# Patient Record
Sex: Female | Born: 1935 | Race: White | Hispanic: No | Smoking: Never smoker
Health system: Southern US, Community
[De-identification: ages and names within clinical notes are randomized; demographics above are authoritative.]

## PROBLEM LIST (undated history)

## (undated) DIAGNOSIS — I10 Essential (primary) hypertension: Secondary | ICD-10-CM

## (undated) HISTORY — PX: OTHER SURGICAL HISTORY: SHX169

## (undated) HISTORY — PX: JOINT REPLACEMENT: SHX530

## (undated) HISTORY — PX: LUMBAR FUSION: SHX111

## (undated) HISTORY — PX: TONSILLECTOMY: SUR1361

## (undated) HISTORY — PX: APPENDECTOMY: SHX54

## (undated) HISTORY — PX: ABDOMINAL HYSTERECTOMY: SHX81

## (undated) HISTORY — PX: BREAST BIOPSY: SHX20

---

## 2000-07-30 ENCOUNTER — Ambulatory Visit (HOSPITAL_COMMUNITY): Admission: RE | Admit: 2000-07-30 | Discharge: 2000-07-30 | Payer: Self-pay | Admitting: Family Medicine

## 2000-07-30 ENCOUNTER — Encounter: Payer: Self-pay | Admitting: Family Medicine

## 2000-07-31 ENCOUNTER — Ambulatory Visit (HOSPITAL_COMMUNITY): Admission: RE | Admit: 2000-07-31 | Discharge: 2000-07-31 | Payer: Self-pay | Admitting: Family Medicine

## 2000-07-31 ENCOUNTER — Encounter: Payer: Self-pay | Admitting: Family Medicine

## 2000-09-10 ENCOUNTER — Ambulatory Visit (HOSPITAL_COMMUNITY): Admission: RE | Admit: 2000-09-10 | Discharge: 2000-09-10 | Payer: Self-pay | Admitting: Cardiology

## 2000-09-17 ENCOUNTER — Encounter: Payer: Self-pay | Admitting: Cardiology

## 2000-09-17 ENCOUNTER — Ambulatory Visit (HOSPITAL_COMMUNITY): Admission: RE | Admit: 2000-09-17 | Discharge: 2000-09-17 | Payer: Self-pay | Admitting: Cardiology

## 2001-09-15 ENCOUNTER — Ambulatory Visit (HOSPITAL_COMMUNITY): Admission: RE | Admit: 2001-09-15 | Discharge: 2001-09-15 | Payer: Self-pay | Admitting: Family Medicine

## 2001-09-15 ENCOUNTER — Encounter: Payer: Self-pay | Admitting: Family Medicine

## 2002-12-14 ENCOUNTER — Ambulatory Visit (HOSPITAL_COMMUNITY): Admission: RE | Admit: 2002-12-14 | Discharge: 2002-12-14 | Payer: Self-pay | Admitting: Family Medicine

## 2002-12-14 ENCOUNTER — Encounter: Payer: Self-pay | Admitting: Family Medicine

## 2002-12-20 ENCOUNTER — Ambulatory Visit (HOSPITAL_COMMUNITY): Admission: RE | Admit: 2002-12-20 | Discharge: 2002-12-20 | Payer: Self-pay | Admitting: Family Medicine

## 2002-12-20 ENCOUNTER — Encounter: Payer: Self-pay | Admitting: Family Medicine

## 2003-06-15 ENCOUNTER — Encounter (HOSPITAL_COMMUNITY): Admission: RE | Admit: 2003-06-15 | Discharge: 2003-07-15 | Payer: Self-pay | Admitting: Family Medicine

## 2003-12-09 ENCOUNTER — Encounter (HOSPITAL_COMMUNITY): Admission: RE | Admit: 2003-12-09 | Discharge: 2004-01-08 | Payer: Self-pay | Admitting: Family Medicine

## 2004-01-10 ENCOUNTER — Ambulatory Visit (HOSPITAL_COMMUNITY): Admission: RE | Admit: 2004-01-10 | Discharge: 2004-01-10 | Payer: Self-pay | Admitting: Family Medicine

## 2004-01-18 ENCOUNTER — Ambulatory Visit (HOSPITAL_COMMUNITY): Admission: RE | Admit: 2004-01-18 | Discharge: 2004-01-18 | Payer: Self-pay | Admitting: Family Medicine

## 2004-02-01 ENCOUNTER — Encounter (HOSPITAL_COMMUNITY): Admission: RE | Admit: 2004-02-01 | Discharge: 2004-03-02 | Payer: Self-pay | Admitting: Family Medicine

## 2005-01-16 ENCOUNTER — Ambulatory Visit (HOSPITAL_COMMUNITY): Admission: RE | Admit: 2005-01-16 | Discharge: 2005-01-16 | Payer: Self-pay | Admitting: Family Medicine

## 2005-01-28 ENCOUNTER — Ambulatory Visit (HOSPITAL_COMMUNITY): Admission: RE | Admit: 2005-01-28 | Discharge: 2005-01-28 | Payer: Self-pay | Admitting: Family Medicine

## 2006-02-11 ENCOUNTER — Ambulatory Visit (HOSPITAL_COMMUNITY): Admission: RE | Admit: 2006-02-11 | Discharge: 2006-02-11 | Payer: Self-pay | Admitting: Family Medicine

## 2006-03-25 ENCOUNTER — Ambulatory Visit: Payer: Self-pay | Admitting: Orthopedic Surgery

## 2006-03-25 ENCOUNTER — Ambulatory Visit (HOSPITAL_COMMUNITY): Admission: RE | Admit: 2006-03-25 | Discharge: 2006-03-25 | Payer: Self-pay | Admitting: Family Medicine

## 2006-03-28 ENCOUNTER — Ambulatory Visit: Payer: Self-pay | Admitting: Orthopedic Surgery

## 2006-03-28 ENCOUNTER — Ambulatory Visit (HOSPITAL_COMMUNITY): Admission: RE | Admit: 2006-03-28 | Discharge: 2006-03-28 | Payer: Self-pay | Admitting: Orthopedic Surgery

## 2006-04-01 ENCOUNTER — Ambulatory Visit: Payer: Self-pay | Admitting: Orthopedic Surgery

## 2006-04-10 ENCOUNTER — Ambulatory Visit: Payer: Self-pay | Admitting: Orthopedic Surgery

## 2006-05-12 ENCOUNTER — Ambulatory Visit: Payer: Self-pay | Admitting: Orthopedic Surgery

## 2006-05-21 ENCOUNTER — Ambulatory Visit: Payer: Self-pay | Admitting: Orthopedic Surgery

## 2006-06-02 ENCOUNTER — Ambulatory Visit: Payer: Self-pay | Admitting: Orthopedic Surgery

## 2006-06-23 ENCOUNTER — Ambulatory Visit: Payer: Self-pay | Admitting: Orthopedic Surgery

## 2006-07-14 ENCOUNTER — Ambulatory Visit: Payer: Self-pay | Admitting: Orthopedic Surgery

## 2007-04-20 ENCOUNTER — Ambulatory Visit: Payer: Self-pay | Admitting: Orthopedic Surgery

## 2007-04-20 DIAGNOSIS — S92309A Fracture of unspecified metatarsal bone(s), unspecified foot, initial encounter for closed fracture: Secondary | ICD-10-CM | POA: Insufficient documentation

## 2007-07-02 ENCOUNTER — Ambulatory Visit: Payer: Self-pay | Admitting: Orthopedic Surgery

## 2009-01-12 ENCOUNTER — Emergency Department (HOSPITAL_COMMUNITY): Admission: EM | Admit: 2009-01-12 | Discharge: 2009-01-12 | Payer: Self-pay | Admitting: Emergency Medicine

## 2009-01-17 ENCOUNTER — Encounter (HOSPITAL_COMMUNITY): Admission: RE | Admit: 2009-01-17 | Discharge: 2009-02-16 | Payer: Self-pay | Admitting: Emergency Medicine

## 2009-01-24 ENCOUNTER — Ambulatory Visit: Payer: Self-pay | Admitting: Orthopedic Surgery

## 2009-01-24 DIAGNOSIS — M549 Dorsalgia, unspecified: Secondary | ICD-10-CM | POA: Insufficient documentation

## 2009-02-20 ENCOUNTER — Encounter (HOSPITAL_COMMUNITY): Admission: RE | Admit: 2009-02-20 | Discharge: 2009-03-22 | Payer: Self-pay | Admitting: Family Medicine

## 2010-07-20 NOTE — H&P (Signed)
[name], [name]              ACCOUNT NO.:  [id number]   MEDICAL RECORD NO.:  [id number]         PATIENT TYPE:  AMB   LOCATION:  DAY                           FACILITY:  [facility]   PHYSICIAN:  Vickki Hearing, M.D.DATE OF BIRTH:  27-Jul-1931   DATE OF ADMISSION:  DATE OF DISCHARGE:  LH                              HISTORY & PHYSICAL   HISTORY AND PHYSICAL   CHIEF COMPLAINT:  Pain right arm.   HISTORY:  This is a 77 year old female who fell on March 24, 2006 and  fractured the right distal radius and ulnar styloid.  She has right  wrist pain which is severe, constant, worsened with movement and  relieved with splinting and medication.  She describes sharp pain since  the date of the injury.  She fell at the [facility] parking lot.  Associated signs and symptoms include deformity and swelling.   MEDICAL REVIEW OF SYSTEMS:  Reveals history of joint swelling, itching,  psoriasis, sinusitis.  Denies weight loss, chest pain, shortness of  breath, GI symptoms, genitourinary problems, neurologic disease,  endocrine problems, psychiatric illness, glaucoma, cataracts with poor  vision.   ALLERGIES:  None.   MEDICAL PROBLEMS:  Hypertension.   PREVIOUS SURGERY:  Hysterectomy, tonsillectomy, breast tumors were  removed.   MEDICATIONS:  1. Premarin.  2. Anti-hypertensive medication.  3. Aspirin.  4. Prednisone.  5. Vicodin as needed.   FAMILY HISTORY:  Arthritis, diabetes.   FAMILY PHYSICIAN:  Dr. Karleen Ponce   SOCIAL HISTORY:  She is married, does not work.  Denies smoking, use of  alcohol or caffeine.  Last grade completed was 12.   PHYSICAL EXAMINATION:  VITAL SIGNS:  Weight 260, pulse 92, respiratory  rate is 18.  APPEARANCE:  Normal.  HEAD, EYES, EARS, NOSE, MOUTH AND THROAT:  Revealed abrasion right side  of her face.  RESPIRATORY:  Clear chest.  CARDIOVASCULAR:  Normal pulses and temperature.  No edema, tenderness,  swelling or varicose veins.  ABDOMEN:   Soft, nondistended, nontender.  LYMPH SYSTEMS:  Cervical lymph nodes benign.  MUSCULOSKELETAL:  Gait normal.  RIGHT UPPER EXTREMITY:  Deformity with malalignment.  Range of motion  not tested, too much pain.  Stability not tested, too much pain.  Muscle  strength not tested, too much pain.  SKIN:  Intact with abrasion over the elbow, but it is a closed fracture.  COORDINATION:  Tests deferred.  Upper extremity - Reflexes deferred.  Upper extremity - Sensory exam was normal to soft touch.  She was  oriented to time, person and place.  Mood and affect were normal.   RADIOGRAPHS:  Radiographs from [facility] show a comminuted distal radius  fracture with ulnar styloid fracture as well.   DIAGNOSIS:  Closed distal radius fracture right wrist.   PLAN:  Closed reduction percutaneous pinning and external fixation.      Vickki Hearing, M.D.  Electronically Signed     SEH/MEDQ  D:  03/27/2006  T:  03/27/2006  Job:  [id number]

## 2010-07-20 NOTE — Op Note (Signed)
NAMEMARCELE, [name]              ACCOUNT NO.:  [id number]   MEDICAL RECORD NO.:  [id number]          PATIENT TYPE:  AMB   LOCATION:  DAY                           FACILITY:  [facility]   PHYSICIAN:  Vickki Hearing, M.D.DATE OF BIRTH:  [birth year]   DATE OF PROCEDURE:  03/28/2006  DATE OF DISCHARGE:                               OPERATIVE REPORT   PREOP DIAGNOSIS:  Closed fracture right distal radius.   POSTOP DIAGNOSIS:  Closed fracture right distal radius.   PROCEDURE:  Closed reduction external fixation, plus percutaneous pins  right distal radius.   SURGEON:  Vickki Hearing, M.D.   ASSISTANTS:  None.   ANESTHETIC:  General, LMA.   OPERATIVE FINDINGS:  Comminuted distal fracture of the radius and ulnar  of the right wrist.  The fracture was closed.   HISTORY:  This 77 year old female fell at the [facility] parking lot and  sustained a closed fracture of the right distal radius; presented  directly to the office where evaluation and surgery was recommended.  The patient accepted the risks and benefits of the procedure, via  informed consent process.   DESCRIPTION OF PROCEDURE:  The patient was identified as Tracy S.  Ponce.  The right wrist was marked for surgery, countersigned by the  surgeon.  History and physical was updated.  Antibiotics were started.  The patient was taken to the operating for general anesthetic via LMA.  After successful anesthesia without complications.  The right upper  extremity was prepped and draped using sterile technique.  The time-out  procedure was then completed.  The procedure was confirmed as stated  above.   The tourniquet was elevated to 275 mmHg.  Closed manipulation was  performed.  Radiographs were done in orthogonal plane to check the  reduction after which finger traps were applied and 10 pounds of weight  were used to hold the reduction; and then pins were placed in the second  metacarpal bone.  Incision was made over  the bone blunt dissection was  carried down to the bone where subperiosteal dissection exposed the  bone.  Two pins were placed and checked with radiographs; 3-0 nylon was  used to close the wound.   Two percutaneous pins were placed in the distal radius; confirmed with C-  arm guidance.  We then made an incision over the mid-to-distal third of  the forearm; divided subcutaneous tissue, maintained hemostasis.  Subperiosteally dissected the bone until we exposed it well and placed 2  more pins which were also checked with radiographs.  The external  fixator was then applied.  Radiographs were checked in orthogonal views  which showed an excellent reduction.  Pins were cut and capped.  Wounds  were dressed; tourniquet was released; fingers had good flexion  passively, and no tension was noted.   The patient was extubated, taken to recovery room in stable condition.   The patient will be discharged on Vicodin 5 mg q.4 h. p.r.n. for pain.  She is also scheduled for followup visit.  She will wear the fixator for  8 weeks.  We will take  serial radiographs to assure reduction is  maintained.  She is encouraged to ice and elevate the hand, move the  fingers every 30 minutes while awake.      Vickki Hearing, M.D.  Electronically Signed     SEH/MEDQ  D:  03/28/2006  T:  03/28/2006  Job:  [id number]

## 2011-06-04 DIAGNOSIS — Z23 Encounter for immunization: Secondary | ICD-10-CM | POA: Diagnosis not present

## 2011-06-04 DIAGNOSIS — I10 Essential (primary) hypertension: Secondary | ICD-10-CM | POA: Diagnosis not present

## 2011-06-04 DIAGNOSIS — Z6841 Body Mass Index (BMI) 40.0 and over, adult: Secondary | ICD-10-CM | POA: Diagnosis not present

## 2011-06-04 DIAGNOSIS — L259 Unspecified contact dermatitis, unspecified cause: Secondary | ICD-10-CM | POA: Diagnosis not present

## 2011-12-19 DIAGNOSIS — Z23 Encounter for immunization: Secondary | ICD-10-CM | POA: Diagnosis not present

## 2012-01-17 ENCOUNTER — Other Ambulatory Visit (HOSPITAL_COMMUNITY): Payer: Self-pay | Admitting: Family Medicine

## 2012-01-17 DIAGNOSIS — L259 Unspecified contact dermatitis, unspecified cause: Secondary | ICD-10-CM | POA: Diagnosis not present

## 2012-01-17 DIAGNOSIS — Z23 Encounter for immunization: Secondary | ICD-10-CM | POA: Diagnosis not present

## 2012-01-17 DIAGNOSIS — Z79899 Other long term (current) drug therapy: Secondary | ICD-10-CM | POA: Diagnosis not present

## 2012-01-17 DIAGNOSIS — F411 Generalized anxiety disorder: Secondary | ICD-10-CM | POA: Diagnosis not present

## 2012-01-17 DIAGNOSIS — Z139 Encounter for screening, unspecified: Secondary | ICD-10-CM

## 2012-01-17 DIAGNOSIS — I10 Essential (primary) hypertension: Secondary | ICD-10-CM | POA: Diagnosis not present

## 2012-01-17 DIAGNOSIS — M549 Dorsalgia, unspecified: Secondary | ICD-10-CM | POA: Diagnosis not present

## 2012-01-17 DIAGNOSIS — N390 Urinary tract infection, site not specified: Secondary | ICD-10-CM | POA: Diagnosis not present

## 2012-01-21 ENCOUNTER — Ambulatory Visit (HOSPITAL_COMMUNITY)
Admission: RE | Admit: 2012-01-21 | Discharge: 2012-01-21 | Disposition: A | Discharge status: Home / Self Care | Payer: Medicare Other | Source: Ambulatory Visit | Attending: Family Medicine | Admitting: Family Medicine

## 2012-01-21 DIAGNOSIS — Z1231 Encounter for screening mammogram for malignant neoplasm of breast: Secondary | ICD-10-CM | POA: Insufficient documentation

## 2012-01-21 DIAGNOSIS — Z139 Encounter for screening, unspecified: Secondary | ICD-10-CM

## 2012-01-21 DIAGNOSIS — Z13820 Encounter for screening for osteoporosis: Secondary | ICD-10-CM | POA: Insufficient documentation

## 2012-01-21 DIAGNOSIS — Z780 Asymptomatic menopausal state: Secondary | ICD-10-CM | POA: Diagnosis not present

## 2012-03-02 DIAGNOSIS — J069 Acute upper respiratory infection, unspecified: Secondary | ICD-10-CM | POA: Diagnosis not present

## 2012-03-02 DIAGNOSIS — Z6839 Body mass index (BMI) 39.0-39.9, adult: Secondary | ICD-10-CM | POA: Diagnosis not present

## 2012-03-02 DIAGNOSIS — L259 Unspecified contact dermatitis, unspecified cause: Secondary | ICD-10-CM | POA: Diagnosis not present

## 2012-03-02 DIAGNOSIS — I10 Essential (primary) hypertension: Secondary | ICD-10-CM | POA: Diagnosis not present

## 2012-06-18 DIAGNOSIS — D235 Other benign neoplasm of skin of trunk: Secondary | ICD-10-CM | POA: Diagnosis not present

## 2012-06-18 DIAGNOSIS — L219 Seborrheic dermatitis, unspecified: Secondary | ICD-10-CM | POA: Diagnosis not present

## 2012-06-18 DIAGNOSIS — L719 Rosacea, unspecified: Secondary | ICD-10-CM | POA: Diagnosis not present

## 2012-07-13 DIAGNOSIS — H2510 Age-related nuclear cataract, unspecified eye: Secondary | ICD-10-CM | POA: Diagnosis not present

## 2012-08-17 DIAGNOSIS — H3530 Unspecified macular degeneration: Secondary | ICD-10-CM | POA: Diagnosis not present

## 2012-08-17 DIAGNOSIS — H2510 Age-related nuclear cataract, unspecified eye: Secondary | ICD-10-CM | POA: Diagnosis not present

## 2012-08-17 DIAGNOSIS — H04129 Dry eye syndrome of unspecified lacrimal gland: Secondary | ICD-10-CM | POA: Diagnosis not present

## 2012-08-19 ENCOUNTER — Encounter (HOSPITAL_COMMUNITY): Payer: Self-pay | Admitting: Pharmacy Technician

## 2012-08-28 ENCOUNTER — Encounter (HOSPITAL_COMMUNITY)
Admission: RE | Admit: 2012-08-28 | Discharge: 2012-08-28 | Disposition: A | Discharge status: Home / Self Care | Payer: Medicare Other | Source: Ambulatory Visit | Attending: Ophthalmology | Admitting: Ophthalmology

## 2012-08-28 ENCOUNTER — Encounter (HOSPITAL_COMMUNITY): Payer: Self-pay

## 2012-08-28 DIAGNOSIS — H2510 Age-related nuclear cataract, unspecified eye: Secondary | ICD-10-CM | POA: Diagnosis not present

## 2012-08-28 DIAGNOSIS — I10 Essential (primary) hypertension: Secondary | ICD-10-CM | POA: Diagnosis not present

## 2012-08-28 DIAGNOSIS — Z79899 Other long term (current) drug therapy: Secondary | ICD-10-CM | POA: Diagnosis not present

## 2012-08-28 DIAGNOSIS — Z01812 Encounter for preprocedural laboratory examination: Secondary | ICD-10-CM | POA: Diagnosis not present

## 2012-08-28 DIAGNOSIS — Z01810 Encounter for preprocedural cardiovascular examination: Secondary | ICD-10-CM | POA: Diagnosis not present

## 2012-08-28 HISTORY — DX: Essential (primary) hypertension: I10

## 2012-08-28 LAB — BASIC METABOLIC PANEL
BUN: 14 mg/dL (ref 6–23)
CO2: 30 mEq/L (ref 19–32)
Chloride: 104 mEq/L (ref 96–112)
Creatinine, Ser: 0.9 mg/dL (ref 0.50–1.10)
GFR calc Af Amer: 69 mL/min — ABNORMAL LOW (ref >90)
GFR calc non Af Amer: 59 mL/min — ABNORMAL LOW (ref >90)
Potassium: 4.6 mEq/L (ref 3.5–5.1)
Sodium: 143 mEq/L (ref 135–145)

## 2012-08-28 LAB — HEMOGLOBIN AND HEMATOCRIT, BLOOD
HCT: 40.5 % (ref 36.0–46.0)
Hemoglobin: 13.9 g/dL (ref 12.0–15.0)

## 2012-08-28 NOTE — Patient Instructions (Addendum)
Your procedure is scheduled on:  September 03, 2012  Report to [facility] at   11:30   AM.  Call this number if you have problems the morning of surgery: [phone]   Remember:   Do not eat or drink :After Midnight.    Take these medicines the morning of surgery with A SIP OF WATER: Metoprolol   Do not wear jewelry, make-up or nail polish.  Do not wear lotions, powders, or perfumes. You may wear deodorant.  Do not shave 48 hours prior to surgery.  Do not bring valuables to the hospital.  Contacts, dentures or bridgework may not be worn into surgery.  Patients discharged the day of surgery will not be allowed to drive home.  Name and phone number of your driver:    Please read over the following fact sheets that you were given: Pain Booklet, Surgical Site Infection Prevention, Anesthesia Post-op Instructions and Care and Recovery After Surgery  Cataract Surgery  A cataract is a clouding of the lens of the eye. When a lens becomes cloudy, vision is reduced based on the degree and nature of the clouding. Surgery may be needed to improve vision. Surgery removes the cloudy lens and usually replaces it with a substitute lens (intraocular lens, IOL). LET YOUR EYE DOCTOR KNOW ABOUT:  Allergies to food or medicine.   Medicines taken including herbs, eyedrops, over-the-counter medicines, and creams.   Use of steroids (by mouth or creams).   Previous problems with anesthetics or numbing medicine.   History of bleeding problems or blood clots.   Previous surgery.   Other health problems, including diabetes and kidney problems.   Possibility of pregnancy, if this applies.  RISKS AND COMPLICATIONS  Infection.   Inflammation of the eyeball (endophthalmitis) that can spread to both eyes (sympathetic ophthalmia).   Poor wound healing.   If an IOL is inserted, it can later fall out of proper position. This is very uncommon.   Clouding of the part of your eye that holds an IOL in place.  This is called an "after-cataract." These are uncommon, but easily treated.  BEFORE THE PROCEDURE  Do not eat or drink anything except small amounts of water for 8 to 12 before your surgery, or as directed by your caregiver.   Unless you are told otherwise, continue any eyedrops you have been prescribed.   Talk to your primary caregiver about all other medicines that you take (both prescription and non-prescription). In some cases, you may need to stop or change medicines near the time of your surgery. This is most important if you are taking blood-thinning medicine.Do not stop medicines unless you are told to do so.   Arrange for someone to drive you to and from the procedure.   Do not put contact lenses in either eye on the day of your surgery.  PROCEDURE There is more than one method for safely removing a cataract. Your doctor can explain the differences and help determine which is best for you. Phacoemulsification surgery is the most common form of cataract surgery.  An injection is given behind the eye or eyedrops are given to make this a painless procedure.   A small cut (incision) is made on the edge of the clear, dome-shaped surface that covers the front of the eye (cornea).   A tiny probe is painlessly inserted into the eye. This device gives off ultrasound waves that soften and break up the cloudy center of the lens. This makes it  easier for the cloudy lens to be removed by suction.   An IOL may be implanted.   The normal lens of the eye is covered by a clear capsule. Part of that capsule is intentionally left in the eye to support the IOL.   Your surgeon may or may not use stitches to close the incision.  There are other forms of cataract surgery that require a larger incision and stiches to close the eye. This approach is taken in cases where the doctor feels that the cataract cannot be easily removed using phacoemulsification. AFTER THE PROCEDURE  When an IOL is  implanted, it does not need care. It becomes a permanent part of your eye and cannot be seen or felt.   Your doctor will schedule follow-up exams to check on your progress.   Review your other medicines with your doctor to see which can be resumed after surgery.   Use eyedrops or take medicine as prescribed by your doctor.  Document Released: 02/07/2011 Document Reviewed: 02/04/2011 [facility] Patient Information 2012 [place].  .Cataract Surgery Care After Refer to this sheet in the next few weeks. These instructions provide you with information on caring for yourself after your procedure. Your caregiver may also give you more specific instructions. Your treatment has been planned according to current medical practices, but problems sometimes occur. Call your caregiver if you have any problems or questions after your procedure.  HOME CARE INSTRUCTIONS   Avoid strenuous activities as directed by your caregiver.   Ask your caregiver when you can resume driving.   Use eyedrops or other medicines to help healing and control pressure inside your eye as directed by your caregiver.   Only take over-the-counter or prescription medicines for pain, discomfort, or fever as directed by your caregiver.   Do not to touch or rub your eyes.   You may be instructed to use a protective shield during the first few days and nights after surgery. If not, wear sunglasses to protect your eyes. This is to protect the eye from pressure or from being accidentally bumped.   Keep the area around your eye clean and dry. Avoid swimming or allowing water to hit you directly in the face while showering. Keep soap and shampoo out of your eyes.   Do not bend or lift heavy objects. Bending increases pressure in the eye. You can walk, climb stairs, and do light household chores.   Do not put a contact lens into the eye that had surgery until your caregiver says it is okay to do so.   Ask your doctor when you can  return to work. This will depend on the kind of work that you do. If you work in a dusty environment, you may be advised to wear protective eyewear for a period of time.   Ask your caregiver when it will be safe to engage in sexual activity.   Continue with your regular eye exams as directed by your caregiver.  What to expect:  It is normal to feel itching and mild discomfort for a few days after cataract surgery. Some fluid discharge is also common, and your eye may be sensitive to light and touch.   After 1 to 2 days, even moderate discomfort should disappear. In most cases, healing will take about 6 weeks.   If you received an intraocular lens (IOL), you may notice that colors are very bright or have a blue tinge. Also, if you have been in bright sunlight, everything  may appear reddish for a few hours. If you see these color tinges, it is because your lens is clear and no longer cloudy. Within a few months after receiving an IOL, these extra colors should go away. When you have healed, you will probably need new glasses.  SEEK MEDICAL CARE IF:   You have increased bruising around your eye.   You have discomfort not helped by medicine.  SEEK IMMEDIATE MEDICAL CARE IF:   You have a fever.   You have a worsening or sudden vision loss.   You have redness, swelling, or increasing pain in the eye.   You have a thick discharge from the eye that had surgery.  MAKE SURE YOU:  Understand these instructions.   Will watch your condition.   Will get help right away if you are not doing well or get worse.  Document Released: 09/07/2004 Document Revised: 02/07/2011 Document Reviewed: 10/12/2010 [facility] Patient Information 2012 [place].

## 2012-09-02 MED ORDER — LIDOCAINE HCL 3.5 % OP GEL
OPHTHALMIC | Status: AC
  Filled 2012-09-02: qty 5

## 2012-09-02 MED ORDER — NEOMYCIN-POLYMYXIN-DEXAMETH 3.5-10000-0.1 OP OINT
TOPICAL_OINTMENT | OPHTHALMIC | Status: AC
  Filled 2012-09-02: qty 3.5

## 2012-09-02 MED ORDER — CYCLOPENTOLATE-PHENYLEPHRINE 0.2-1 % OP SOLN
OPHTHALMIC | Status: AC
  Filled 2012-09-02: qty 2

## 2012-09-02 MED ORDER — TETRACAINE HCL 0.5 % OP SOLN
OPHTHALMIC | Status: AC
  Filled 2012-09-02: qty 2

## 2012-09-02 MED ORDER — LIDOCAINE HCL (PF) 1 % IJ SOLN
INTRAMUSCULAR | Status: AC
  Filled 2012-09-02: qty 2

## 2012-09-03 ENCOUNTER — Ambulatory Visit (HOSPITAL_COMMUNITY)
Admission: RE | Admit: 2012-09-03 | Discharge: 2012-09-03 | Disposition: A | Discharge status: Home / Self Care | Payer: Medicare Other | Source: Ambulatory Visit | Attending: Ophthalmology | Admitting: Ophthalmology

## 2012-09-03 ENCOUNTER — Encounter (HOSPITAL_COMMUNITY): Payer: Self-pay | Admitting: Anesthesiology

## 2012-09-03 ENCOUNTER — Ambulatory Visit (HOSPITAL_COMMUNITY): Payer: Medicare Other | Admitting: Anesthesiology

## 2012-09-03 ENCOUNTER — Encounter (HOSPITAL_COMMUNITY): Payer: Self-pay | Admitting: Ophthalmology

## 2012-09-03 ENCOUNTER — Encounter (HOSPITAL_COMMUNITY)
Admission: RE | Disposition: A | Discharge status: Home / Self Care | Payer: Self-pay | Source: Ambulatory Visit | Attending: Ophthalmology

## 2012-09-03 DIAGNOSIS — H269 Unspecified cataract: Secondary | ICD-10-CM | POA: Diagnosis not present

## 2012-09-03 DIAGNOSIS — Z01812 Encounter for preprocedural laboratory examination: Secondary | ICD-10-CM | POA: Insufficient documentation

## 2012-09-03 DIAGNOSIS — Z79899 Other long term (current) drug therapy: Secondary | ICD-10-CM | POA: Insufficient documentation

## 2012-09-03 DIAGNOSIS — Z01810 Encounter for preprocedural cardiovascular examination: Secondary | ICD-10-CM | POA: Insufficient documentation

## 2012-09-03 DIAGNOSIS — H2510 Age-related nuclear cataract, unspecified eye: Secondary | ICD-10-CM | POA: Insufficient documentation

## 2012-09-03 DIAGNOSIS — I10 Essential (primary) hypertension: Secondary | ICD-10-CM | POA: Insufficient documentation

## 2012-09-03 HISTORY — PX: CATARACT EXTRACTION W/PHACO: SHX586

## 2012-09-03 SURGERY — PHACOEMULSIFICATION, CATARACT, WITH IOL INSERTION
Anesthesia: Monitor Anesthesia Care | Site: Eye | Laterality: Right | Wound class: Clean

## 2012-09-03 MED ORDER — PROVISC 10 MG/ML IO SOLN
INTRAOCULAR | Status: DC | PRN
  Administered 2012-09-03: 8.5 mg via INTRAOCULAR

## 2012-09-03 MED ORDER — MIDAZOLAM HCL 2 MG/2ML IJ SOLN
1.0000 mg | INTRAMUSCULAR | Status: DC | PRN
  Administered 2012-09-03: 2 mg via INTRAVENOUS

## 2012-09-03 MED ORDER — LIDOCAINE HCL (PF) 1 % IJ SOLN
INTRAMUSCULAR | Status: DC | PRN
  Administered 2012-09-03: .4 mL

## 2012-09-03 MED ORDER — LIDOCAINE HCL 3.5 % OP GEL
1.0000 "application " | Freq: Once | OPHTHALMIC | Status: AC
  Administered 2012-09-03: 1 via OPHTHALMIC

## 2012-09-03 MED ORDER — MIDAZOLAM HCL 2 MG/2ML IJ SOLN
INTRAMUSCULAR | Status: AC
  Filled 2012-09-03: qty 2

## 2012-09-03 MED ORDER — NEOMYCIN-POLYMYXIN-DEXAMETH 0.1 % OP OINT
TOPICAL_OINTMENT | OPHTHALMIC | Status: DC | PRN
  Administered 2012-09-03: 1 via OPHTHALMIC

## 2012-09-03 MED ORDER — PHENYLEPHRINE HCL 2.5 % OP SOLN
OPHTHALMIC | Status: AC
  Filled 2012-09-03: qty 15

## 2012-09-03 MED ORDER — EPINEPHRINE HCL 1 MG/ML IJ SOLN
INTRAMUSCULAR | Status: AC
  Filled 2012-09-03: qty 1

## 2012-09-03 MED ORDER — BSS IO SOLN
INTRAOCULAR | Status: DC | PRN
  Administered 2012-09-03: 15 mL via INTRAOCULAR

## 2012-09-03 MED ORDER — LACTATED RINGERS IV SOLN
INTRAVENOUS | Status: DC
  Administered 2012-09-03: 1000 mL via INTRAVENOUS

## 2012-09-03 MED ORDER — TETRACAINE HCL 0.5 % OP SOLN
1.0000 [drp] | OPHTHALMIC | Status: AC
  Administered 2012-09-03 (×3): 1 [drp] via OPHTHALMIC

## 2012-09-03 MED ORDER — EPINEPHRINE HCL 1 MG/ML IJ SOLN
INTRAOCULAR | Status: DC | PRN
  Administered 2012-09-03: 13:00:00

## 2012-09-03 MED ORDER — PHENYLEPHRINE HCL 2.5 % OP SOLN
1.0000 [drp] | OPHTHALMIC | Status: AC
  Administered 2012-09-03 (×3): 1 [drp] via OPHTHALMIC

## 2012-09-03 MED ORDER — POVIDONE-IODINE 5 % OP SOLN
OPHTHALMIC | Status: DC | PRN
  Administered 2012-09-03: 1 via OPHTHALMIC

## 2012-09-03 MED ORDER — CYCLOPENTOLATE-PHENYLEPHRINE 0.2-1 % OP SOLN
1.0000 [drp] | OPHTHALMIC | Status: AC
  Administered 2012-09-03 (×3): 1 [drp] via OPHTHALMIC

## 2012-09-03 SURGICAL SUPPLY — 32 items
CAPSULAR TENSION RING-AMO (OPHTHALMIC RELATED) IMPLANT
CLOTH BEACON ORANGE TIMEOUT ST (SAFETY) ×1 IMPLANT
EYE SHIELD UNIVERSAL CLEAR (GAUZE/BANDAGES/DRESSINGS) ×1 IMPLANT
GLOVE BIO SURGEON STRL SZ 6.5 (GLOVE) IMPLANT
GLOVE BIOGEL PI IND STRL 6.5 (GLOVE) IMPLANT
GLOVE BIOGEL PI IND STRL 7.0 (GLOVE) IMPLANT
GLOVE BIOGEL PI IND STRL 7.5 (GLOVE) IMPLANT
GLOVE BIOGEL PI INDICATOR 6.5 (GLOVE)
GLOVE BIOGEL PI INDICATOR 7.0 (GLOVE) ×2
GLOVE BIOGEL PI INDICATOR 7.5 (GLOVE)
GLOVE ECLIPSE 6.5 STRL STRAW (GLOVE) IMPLANT
GLOVE ECLIPSE 7.0 STRL STRAW (GLOVE) IMPLANT
GLOVE ECLIPSE 7.5 STRL STRAW (GLOVE) IMPLANT
GLOVE EXAM NITRILE LRG STRL (GLOVE) IMPLANT
GLOVE EXAM NITRILE MD LF STRL (GLOVE) IMPLANT
GLOVE SKINSENSE NS SZ6.5 (GLOVE)
GLOVE SKINSENSE NS SZ7.0 (GLOVE)
GLOVE SKINSENSE STRL SZ6.5 (GLOVE) IMPLANT
GLOVE SKINSENSE STRL SZ7.0 (GLOVE) IMPLANT
KIT VITRECTOMY (OPHTHALMIC RELATED) IMPLANT
PAD ARMBOARD 7.5X6 YLW CONV (MISCELLANEOUS) ×1 IMPLANT
PROC W NO LENS (INTRAOCULAR LENS)
PROC W SPEC LENS (INTRAOCULAR LENS)
PROCESS W NO LENS (INTRAOCULAR LENS) IMPLANT
PROCESS W SPEC LENS (INTRAOCULAR LENS) IMPLANT
RING MALYGIN (MISCELLANEOUS) IMPLANT
SIGHTPATH CAT PROC W REG LENS (Ophthalmic Related) ×2 IMPLANT
SYR TB 1ML LL NO SAFETY (SYRINGE) ×1 IMPLANT
TAPE SURG TRANSPORE 1 IN (GAUZE/BANDAGES/DRESSINGS) IMPLANT
TAPE SURGICAL TRANSPORE 1 IN (GAUZE/BANDAGES/DRESSINGS) ×1
VISCOELASTIC ADDITIONAL (OPHTHALMIC RELATED) IMPLANT
WATER STERILE IRR 250ML POUR (IV SOLUTION) ×1 IMPLANT

## 2012-09-03 NOTE — H&P (Signed)
I have reviewed the H&P, the patient was re-examined, and I have identified no interval changes in medical condition and plan of care since the history and physical of record  

## 2012-09-03 NOTE — Anesthesia Postprocedure Evaluation (Signed)
  Anesthesia Post-op Note  Patient: [name]  Procedure(s) Performed: Procedure(s) with comments: CATARACT EXTRACTION PHACO AND INTRAOCULAR LENS PLACEMENT (IOC) (Right) - CDE: 12.37  Patient Location: Short Stay  Anesthesia Type:MAC  Level of Consciousness: awake, alert  and oriented  Airway and Oxygen Therapy: Patient Spontanous Breathing  Post-op Pain: none  Post-op Assessment: Post-op Vital signs reviewed, Patient's Cardiovascular Status Stable and Respiratory Function Stable  Post-op Vital Signs: Reviewed and stable  Complications: No apparent anesthesia complications

## 2012-09-03 NOTE — Transfer of Care (Signed)
Immediate Anesthesia Transfer of Care Note  Patient: [name]  Procedure(s) Performed: Procedure(s) with comments: CATARACT EXTRACTION PHACO AND INTRAOCULAR LENS PLACEMENT (IOC) (Right) - CDE: 12.37  Patient Location: Short Stay  Anesthesia Type:MAC  Level of Consciousness: awake, alert  and oriented  Airway & Oxygen Therapy: Patient Spontanous Breathing  Post-op Assessment: Report given to PACU RN and Post -op Vital signs reviewed and stable  Post vital signs: Reviewed and stable  Complications: No apparent anesthesia complications

## 2012-09-03 NOTE — Anesthesia Preprocedure Evaluation (Signed)
Anesthesia Evaluation  Patient identified by MRN, date of birth, ID band Patient awake    Reviewed: Allergy & Precautions, H&P , Patient's Chart, lab work & pertinent test results, reviewed documented beta blocker date and time   Airway Mallampati: I      Dental  (+) Teeth Intact   Pulmonary  breath sounds clear to auscultation        Cardiovascular hypertension, Rhythm:Regular Rate:Normal     Neuro/Psych    GI/Hepatic   Endo/Other    Renal/GU      Musculoskeletal   Abdominal   Peds  Hematology   Anesthesia Other Findings   Reproductive/Obstetrics                           Anesthesia Physical Anesthesia Plan  ASA: II  Anesthesia Plan: MAC   Post-op Pain Management:    Induction: Intravenous  Airway Management Planned: Nasal Cannula  Additional Equipment:   Intra-op Plan:   Post-operative Plan:   Informed Consent: I have reviewed the patients History and Physical, chart, labs and discussed the procedure including the risks, benefits and alternatives for the proposed anesthesia with the patient or authorized representative who has indicated his/her understanding and acceptance.     Plan Discussed with:   Anesthesia Plan Comments:         Anesthesia Quick Evaluation  

## 2012-09-03 NOTE — Op Note (Signed)
Date of Admission: 09/03/2012  Date of Surgery: 09/03/2012  Pre-Op Dx: Cataract  Right  Eye  Post-Op Dx: Nuclear Cataract  Right  Eye,  Dx Code 366.16  Surgeon: Gemma Payor, M.D.  Assistants: None  Anesthesia: Topical with MAC  Indications: Painless, progressive loss of vision with compromise of daily activities.  Surgery: Cataract Extraction with Intraocular lens Implant Right Eye  Discription: The patient had dilating drops and viscous lidocaine placed into the left eye in the pre-op holding area. After transfer to the operating room, a time out was performed. The patient was then prepped and draped. Beginning with a 75 degree blade a paracentesis port was made at the surgeon's 2 o'clock position. The anterior chamber was then filled with 1% non-preserved lidocaine. This was followed by filling the anterior chamber with Provisc. A bent cystatome needle was used to create a continuous tear capsulotomy. Hydrodissection was performed with balanced salt solution on a Fine canula. The lens nucleus was then removed using the phacoemulsification handpiece. Residual cortex was removed with the I&A handpiece. The anterior chamber and capsular bag were refilled with Provisc. A posterior chamber intraocular lens was placed into the capsular bag with it's injector. The implant was positioned with the Kuglan hook. The Provisc was then removed from the anterior chamber and capsular bag with the I&A handpiece. Stromal hydration of the main incision and paracentesis port was performed with BSS on a Fine canula. The wounds were tested for leak which was negative. The patient tolerated the procedure well. There were no operative complications. The patient was then transferred to the recovery room in stable condition.  Complications: None  Specimen: None  EBL: None  Prosthetic device: B&L enVista, MX60, power 23.5D, #[id number].

## 2012-09-07 ENCOUNTER — Encounter (HOSPITAL_COMMUNITY): Payer: Self-pay | Admitting: Ophthalmology

## 2012-09-17 ENCOUNTER — Encounter (HOSPITAL_COMMUNITY): Payer: Self-pay | Admitting: Pharmacy Technician

## 2012-09-17 DIAGNOSIS — H04129 Dry eye syndrome of unspecified lacrimal gland: Secondary | ICD-10-CM | POA: Diagnosis not present

## 2012-09-17 DIAGNOSIS — H2510 Age-related nuclear cataract, unspecified eye: Secondary | ICD-10-CM | POA: Diagnosis not present

## 2012-09-21 ENCOUNTER — Encounter (HOSPITAL_COMMUNITY): Payer: Self-pay

## 2012-09-21 ENCOUNTER — Encounter (HOSPITAL_COMMUNITY)
Admission: RE | Admit: 2012-09-21 | Discharge: 2012-09-21 | Disposition: A | Discharge status: Home / Self Care | Payer: Medicare Other | Source: Ambulatory Visit | Attending: Ophthalmology | Admitting: Ophthalmology

## 2012-09-21 MED ORDER — FENTANYL CITRATE 0.05 MG/ML IJ SOLN: 25.0000 ug | INTRAMUSCULAR | Status: DC | PRN

## 2012-09-21 MED ORDER — ONDANSETRON HCL 4 MG/2ML IJ SOLN: 4.0000 mg | Freq: Once | INTRAMUSCULAR | Status: AC | PRN

## 2012-09-25 MED ORDER — CYCLOPENTOLATE-PHENYLEPHRINE 0.2-1 % OP SOLN
OPHTHALMIC | Status: AC
  Filled 2012-09-25: qty 2

## 2012-09-25 MED ORDER — LIDOCAINE HCL 3.5 % OP GEL
OPHTHALMIC | Status: AC
  Filled 2012-09-25: qty 5

## 2012-09-25 MED ORDER — LIDOCAINE HCL (PF) 1 % IJ SOLN
INTRAMUSCULAR | Status: AC
  Filled 2012-09-25: qty 2

## 2012-09-25 MED ORDER — NEOMYCIN-POLYMYXIN-DEXAMETH 3.5-10000-0.1 OP OINT
TOPICAL_OINTMENT | OPHTHALMIC | Status: AC
  Filled 2012-09-25: qty 3.5

## 2012-09-25 MED ORDER — TETRACAINE HCL 0.5 % OP SOLN
OPHTHALMIC | Status: AC
  Filled 2012-09-25: qty 2

## 2012-09-28 ENCOUNTER — Encounter (HOSPITAL_COMMUNITY)
Admission: RE | Disposition: A | Discharge status: Home / Self Care | Payer: Self-pay | Source: Ambulatory Visit | Attending: Ophthalmology

## 2012-09-28 ENCOUNTER — Ambulatory Visit (HOSPITAL_COMMUNITY): Payer: Medicare Other | Admitting: Anesthesiology

## 2012-09-28 ENCOUNTER — Encounter (HOSPITAL_COMMUNITY): Payer: Self-pay | Admitting: Anesthesiology

## 2012-09-28 ENCOUNTER — Ambulatory Visit (HOSPITAL_COMMUNITY)
Admission: RE | Admit: 2012-09-28 | Discharge: 2012-09-28 | Disposition: A | Discharge status: Home / Self Care | Payer: Medicare Other | Source: Ambulatory Visit | Attending: Ophthalmology | Admitting: Ophthalmology

## 2012-09-28 ENCOUNTER — Encounter (HOSPITAL_COMMUNITY): Payer: Self-pay | Admitting: *Deleted

## 2012-09-28 DIAGNOSIS — I10 Essential (primary) hypertension: Secondary | ICD-10-CM | POA: Diagnosis not present

## 2012-09-28 DIAGNOSIS — H2510 Age-related nuclear cataract, unspecified eye: Secondary | ICD-10-CM | POA: Insufficient documentation

## 2012-09-28 DIAGNOSIS — H269 Unspecified cataract: Secondary | ICD-10-CM | POA: Diagnosis not present

## 2012-09-28 HISTORY — PX: CATARACT EXTRACTION W/PHACO: SHX586

## 2012-09-28 SURGERY — PHACOEMULSIFICATION, CATARACT, WITH IOL INSERTION
Anesthesia: Monitor Anesthesia Care | Site: Eye | Laterality: Left | Wound class: Clean

## 2012-09-28 MED ORDER — NEOMYCIN-POLYMYXIN-DEXAMETH 0.1 % OP OINT
TOPICAL_OINTMENT | OPHTHALMIC | Status: DC | PRN
  Administered 2012-09-28: 1 via OPHTHALMIC

## 2012-09-28 MED ORDER — POVIDONE-IODINE 5 % OP SOLN
OPHTHALMIC | Status: DC | PRN
  Administered 2012-09-28: 1 via OPHTHALMIC

## 2012-09-28 MED ORDER — MIDAZOLAM HCL 2 MG/2ML IJ SOLN
INTRAMUSCULAR | Status: AC
  Filled 2012-09-28: qty 2

## 2012-09-28 MED ORDER — PROVISC 10 MG/ML IO SOLN
INTRAOCULAR | Status: DC | PRN
  Administered 2012-09-28: 8.5 mg via INTRAOCULAR

## 2012-09-28 MED ORDER — PHENYLEPHRINE HCL 2.5 % OP SOLN
1.0000 [drp] | OPHTHALMIC | Status: AC
  Administered 2012-09-28 (×3): 1 [drp] via OPHTHALMIC

## 2012-09-28 MED ORDER — CYCLOPENTOLATE-PHENYLEPHRINE 0.2-1 % OP SOLN
1.0000 [drp] | OPHTHALMIC | Status: AC
  Administered 2012-09-28 (×3): 1 [drp] via OPHTHALMIC

## 2012-09-28 MED ORDER — EPINEPHRINE HCL 1 MG/ML IJ SOLN
INTRAOCULAR | Status: DC | PRN
  Administered 2012-09-28: 13:00:00

## 2012-09-28 MED ORDER — LACTATED RINGERS IV SOLN
INTRAVENOUS | Status: DC
  Administered 2012-09-28: 1000 mL via INTRAVENOUS

## 2012-09-28 MED ORDER — LIDOCAINE HCL (PF) 1 % IJ SOLN
INTRAMUSCULAR | Status: DC | PRN
  Administered 2012-09-28: .4 mL

## 2012-09-28 MED ORDER — BSS IO SOLN
INTRAOCULAR | Status: DC | PRN
  Administered 2012-09-28: 15 mL via INTRAOCULAR

## 2012-09-28 MED ORDER — TETRACAINE HCL 0.5 % OP SOLN
1.0000 [drp] | OPHTHALMIC | Status: AC
  Administered 2012-09-28 (×3): 1 [drp] via OPHTHALMIC

## 2012-09-28 MED ORDER — MIDAZOLAM HCL 2 MG/2ML IJ SOLN
1.0000 mg | INTRAMUSCULAR | Status: DC | PRN
  Administered 2012-09-28 (×2): 2 mg via INTRAVENOUS

## 2012-09-28 MED ORDER — LIDOCAINE HCL 3.5 % OP GEL
1.0000 "application " | Freq: Once | OPHTHALMIC | Status: AC
  Administered 2012-09-28: 1 via OPHTHALMIC

## 2012-09-28 SURGICAL SUPPLY — 32 items

## 2012-09-28 NOTE — Anesthesia Postprocedure Evaluation (Signed)
  Anesthesia Post-op Note  Patient: [name]  Procedure(s) Performed: Procedure(s) with comments: CATARACT EXTRACTION PHACO AND INTRAOCULAR LENS PLACEMENT (IOC) (Left) - CDE 18.52  Patient Location: Short Stay  Anesthesia Type:MAC  Level of Consciousness: awake, alert , oriented and patient cooperative  Airway and Oxygen Therapy: Patient Spontanous Breathing  Post-op Pain: none  Post-op Assessment: Post-op Vital signs reviewed, Patient's Cardiovascular Status Stable, Respiratory Function Stable, Patent Airway and Pain level controlled  Post-op Vital Signs: Reviewed and stable  Complications: No apparent anesthesia complications

## 2012-09-28 NOTE — Anesthesia Preprocedure Evaluation (Signed)
Anesthesia Evaluation  Patient identified by MRN, date of birth, ID band Patient awake    Reviewed: Allergy & Precautions, H&P , Patient's Chart, lab work & pertinent test results, reviewed documented beta blocker date and time   Airway Mallampati: I      Dental  (+) Teeth Intact   Pulmonary  breath sounds clear to auscultation        Cardiovascular hypertension, Rhythm:Regular Rate:Normal     Neuro/Psych    GI/Hepatic   Endo/Other    Renal/GU      Musculoskeletal   Abdominal   Peds  Hematology   Anesthesia Other Findings   Reproductive/Obstetrics                           Anesthesia Physical Anesthesia Plan  ASA: II  Anesthesia Plan: MAC   Post-op Pain Management:    Induction: Intravenous  Airway Management Planned: Nasal Cannula  Additional Equipment:   Intra-op Plan:   Post-operative Plan:   Informed Consent: I have reviewed the patients History and Physical, chart, labs and discussed the procedure including the risks, benefits and alternatives for the proposed anesthesia with the patient or authorized representative who has indicated his/her understanding and acceptance.     Plan Discussed with:   Anesthesia Plan Comments:         Anesthesia Quick Evaluation

## 2012-09-28 NOTE — Transfer of Care (Signed)
Immediate Anesthesia Transfer of Care Note  Patient: [name]  Procedure(s) Performed: Procedure(s) with comments: CATARACT EXTRACTION PHACO AND INTRAOCULAR LENS PLACEMENT (IOC) (Left) - CDE 18.52  Patient Location: Short Stay  Anesthesia Type:MAC  Level of Consciousness: awake, alert , oriented and patient cooperative  Airway & Oxygen Therapy: Patient Spontanous Breathing  Post-op Assessment: Report given to PACU RN, Post -op Vital signs reviewed and stable and Patient moving all extremities  Post vital signs: Reviewed and stable  Complications: No apparent anesthesia complications

## 2012-09-28 NOTE — Op Note (Signed)
Date of Admission: 09/28/2012  Date of Surgery: 09/28/2012  Pre-Op Dx: Cataract  Left  Eye  Post-Op Dx: Cataract  Left  Eye,  Dx Code 366.16  Surgeon: Gemma Payor, M.D.  Assistants: None  Anesthesia: Topical with MAC  Indications: Painless, progressive loss of vision with compromise of daily activities.  Surgery: Cataract Extraction with Intraocular lens Implant Left Eye  Discription: The patient had dilating drops and viscous lidocaine placed into the left eye in the pre-op holding area. After transfer to the operating room, a time out was performed. The patient was then prepped and draped. Beginning with a 75 degree blade a paracentesis port was made at the surgeon's 2 o'clock position. The anterior chamber was then filled with 1% non-preserved lidocaine. This was followed by filling the anterior chamber with Provisc. A bent cystatome needle was used to create a continuous tear capsulotomy. Hydrodissection was performed with balanced salt solution on a Fine canula. The lens nucleus was then removed using the phacoemulsification handpiece. Residual cortex was removed with the I&A handpiece. The anterior chamber and capsular bag were refilled with Provisc. A posterior chamber intraocular lens was placed into the capsular bag with it's injector. The implant was positioned with the Kuglan hook. The Provisc was then removed from the anterior chamber and capsular bag with the I&A handpiece. Stromal hydration of the main incision and paracentesis port was performed with BSS on a Fine canula. The wounds were tested for leak which was negative. The patient tolerated the procedure well. There were no operative complications. The patient was then transferred to the recovery room in stable condition.  Complications: None  Specimen: None  EBL: None  Prosthetic device: B&L enVista, MX60, power 23.5D, SN [id number].

## 2012-09-28 NOTE — H&P (Signed)
I have reviewed the H&P, the patient was re-examined, and I have identified no interval changes in medical condition and plan of care since the history and physical of record  

## 2012-09-29 ENCOUNTER — Encounter (HOSPITAL_COMMUNITY): Payer: Self-pay | Admitting: Ophthalmology

## 2012-11-10 DIAGNOSIS — Z23 Encounter for immunization: Secondary | ICD-10-CM | POA: Diagnosis not present

## 2012-11-10 DIAGNOSIS — E669 Obesity, unspecified: Secondary | ICD-10-CM | POA: Diagnosis not present

## 2012-11-10 DIAGNOSIS — M159 Polyosteoarthritis, unspecified: Secondary | ICD-10-CM | POA: Diagnosis not present

## 2012-11-10 DIAGNOSIS — Z6841 Body Mass Index (BMI) 40.0 and over, adult: Secondary | ICD-10-CM | POA: Diagnosis not present

## 2012-11-10 DIAGNOSIS — I10 Essential (primary) hypertension: Secondary | ICD-10-CM | POA: Diagnosis not present

## 2013-01-05 DIAGNOSIS — Q828 Other specified congenital malformations of skin: Secondary | ICD-10-CM | POA: Diagnosis not present

## 2013-01-05 DIAGNOSIS — M79609 Pain in unspecified limb: Secondary | ICD-10-CM | POA: Diagnosis not present

## 2013-01-05 DIAGNOSIS — M25579 Pain in unspecified ankle and joints of unspecified foot: Secondary | ICD-10-CM | POA: Diagnosis not present

## 2013-05-03 ENCOUNTER — Encounter (HOSPITAL_COMMUNITY): Payer: Self-pay | Admitting: Emergency Medicine

## 2013-05-03 ENCOUNTER — Emergency Department (HOSPITAL_COMMUNITY)
Admission: EM | Admit: 2013-05-03 | Discharge: 2013-05-04 | Disposition: A | Discharge status: Home / Self Care | Payer: Medicare Other | Attending: Emergency Medicine | Admitting: Emergency Medicine

## 2013-05-03 DIAGNOSIS — R1013 Epigastric pain: Secondary | ICD-10-CM | POA: Diagnosis not present

## 2013-05-03 DIAGNOSIS — Z79899 Other long term (current) drug therapy: Secondary | ICD-10-CM | POA: Diagnosis not present

## 2013-05-03 DIAGNOSIS — Z90710 Acquired absence of both cervix and uterus: Secondary | ICD-10-CM | POA: Diagnosis not present

## 2013-05-03 DIAGNOSIS — R110 Nausea: Secondary | ICD-10-CM | POA: Diagnosis not present

## 2013-05-03 DIAGNOSIS — Z8719 Personal history of other diseases of the digestive system: Secondary | ICD-10-CM | POA: Diagnosis not present

## 2013-05-03 DIAGNOSIS — N281 Cyst of kidney, acquired: Secondary | ICD-10-CM | POA: Diagnosis not present

## 2013-05-03 DIAGNOSIS — R300 Dysuria: Secondary | ICD-10-CM | POA: Diagnosis not present

## 2013-05-03 DIAGNOSIS — I10 Essential (primary) hypertension: Secondary | ICD-10-CM | POA: Diagnosis not present

## 2013-05-03 DIAGNOSIS — R109 Unspecified abdominal pain: Secondary | ICD-10-CM

## 2013-05-03 DIAGNOSIS — Z9089 Acquired absence of other organs: Secondary | ICD-10-CM | POA: Insufficient documentation

## 2013-05-03 MED ORDER — ONDANSETRON HCL 4 MG/2ML IJ SOLN
4.0000 mg | Freq: Once | INTRAMUSCULAR | Status: AC
  Administered 2013-05-03: 4 mg via INTRAVENOUS
  Filled 2013-05-03: qty 2

## 2013-05-03 MED ORDER — MORPHINE SULFATE 4 MG/ML IJ SOLN
4.0000 mg | Freq: Once | INTRAMUSCULAR | Status: AC
  Administered 2013-05-03: 4 mg via INTRAVENOUS
  Filled 2013-05-03: qty 1

## 2013-05-03 NOTE — ED Provider Notes (Signed)
CSN: [id number]     Arrival date & time 05/03/13  2007 History   First MD Initiated Contact with Patient 05/03/13 2227  This chart was scribed for Sharyon Cable, MD by Anastasia Pall, ED Scribe. This patient was seen in room APA11/APA11 and the patient's care was started at 11:18 PM.    Chief Complaint  Patient presents with  . Abdominal Pain    Patient is a 80 y.o. female presenting with abdominal pain. The history is provided by the patient. No language interpreter was used.  Abdominal Pain Pain location:  Epigastric Pain severity:  Severe Onset quality:  Sudden Duration: earlier today. Timing:  Constant Chronicity:  Recurrent (h/o pancreatitis, last flare up 15 years ago) Context: not alcohol use   Associated symptoms: dysuria (burning with urination) and nausea   Associated symptoms: no chest pain, no chills, no constipation, no diarrhea, no fever, no shortness of breath and no vomiting    HPI Comments: [name] is a 80 y.o. female who presents to the Emergency Department complaining of sudden onset, severe, epigastric abdominal pain, onset earlier this evening. She reports h/o pancreatitis, but states last flare up was 15 years ago. She states her current pain is consistent with her last flare up. She denies recent EtOH use. She states she has not felt well over the last few days. She reports associated nausea and burning with urination.She reports having normal bowel movements. She denies abdominal distention, fever, vomiting, diarrhea, constipation, hematochezia, chest pain, SOB, urinary frequency, weakness in extremities, and any other associated symptoms. She reports h/o complete hysterectomy, appendectomy. She denies h/o cholecystectomy.   PCP - Leonides Grills, MD  Past Medical History  Diagnosis Date  . Hypertension    Past Surgical History  Procedure Laterality Date  . Closed redution external fixation right wrist Right   . Appendectomy    . Abdominal  hysterectomy    . Tonsillectomy    . Breast biopsy    . Cataract extraction w/phaco Right 09/03/2012    Procedure: CATARACT EXTRACTION PHACO AND INTRAOCULAR LENS PLACEMENT (IOC);  Surgeon: Tonny Branch, MD;  Location: [facility];  Service: Ophthalmology;  Laterality: Right;  CDE: 12.37  . Cataract extraction w/phaco Left 09/28/2012    Procedure: CATARACT EXTRACTION PHACO AND INTRAOCULAR LENS PLACEMENT (IOC);  Surgeon: Tonny Branch, MD;  Location: [facility];  Service: Ophthalmology;  Laterality: Left;  CDE 18.52   History reviewed. No pertinent family history. History  Substance Use Topics  . Smoking status: Never Smoker   . Smokeless tobacco: Not on file  . Alcohol Use: No   OB History   Grav Para Term Preterm Abortions TAB SAB Ect Mult Living                 Review of Systems  Constitutional: Negative for fever, chills and diaphoresis.  Respiratory: Negative for shortness of breath.   Cardiovascular: Negative for chest pain.  Gastrointestinal: Positive for nausea and abdominal pain. Negative for vomiting, diarrhea, constipation, blood in stool and abdominal distention.  Genitourinary: Positive for dysuria (burning with urination). Negative for frequency.  Neurological: Negative for weakness and numbness.  All other systems reviewed and are negative.   Allergies  Review of patient's allergies indicates no known allergies.  Home Medications   Current Outpatient Rx  Name  Route  Sig  Dispense  Refill  . metoprolol succinate (TOPROL-XL) 100 MG 24 hr tablet   Oral   Take 100 mg by mouth daily. Take with  or immediately following a meal.         . Nutritional Supplements (ESTROVEN PO)   Oral   Take 1 tablet by mouth daily.          BP 167/88  Pulse 80  Temp(Src) 97.3 F (36.3 C) (Oral)  Resp 16  Ht 5\' 5"  (1.651 m)  Wt 220 lb (99.791 kg)  BMI 36.61 kg/m2  SpO2 95% BP 151/64  Pulse 63  Temp(Src) 97.3 F (36.3 C) (Oral)  Resp 13  Ht 5\' 5"  (1.651 m)  Wt 220 lb (99.791 kg)   BMI 36.61 kg/m2  SpO2 97%  Physical Exam  Nursing note and vitals reviewed.  CONSTITUTIONAL: Well developed/well nourished HEAD: Normocephalic/atraumatic EYES: EOMI/PERRL No icterus.  ENMT: Mucous membranes moist NECK: supple no meningeal signs SPINE:entire spine nontender CV: S1/S2 noted, no murmurs/rubs/gallops noted LUNGS: Lungs are clear to auscultation bilaterally, no apparent distress ABDOMEN: soft, moderate epigastric tenderness, no rebound or guarding GU:no cva tenderness NEURO: Pt is awake/alert, moves all extremitiesx4 EXTREMITIES: pulses normal, full ROM, chronic Venous stasis ulcers noted, no overlying erythema  SKIN: warm, color normal PSYCH: no abnormalities of mood noted  ED Course  Procedures   Due to continued pain of unclear etiology and give age/body habitus, CT imaging ordered  CT scan without acute findings Pt reported improvement She requested d/c home We discussed strict return precautions and appropriate use oral narcotic meds Pt/family agreeable with plan  DIAGNOSTIC STUDIES: Oxygen Saturation is 95% on room air, adequate by my interpretation.    COORDINATION OF CARE: 11:24 PM-Discussed treatment plan which includes pain medication, blood work, UA, and IV fluids with pt at bedside and pt agreed to plan.   Results for orders placed during the hospital encounter of 05/03/13  COMPREHENSIVE METABOLIC PANEL      Result Value Ref Range   Sodium 141  137 - 147 mEq/L   Potassium 4.1  3.7 - 5.3 mEq/L   Chloride 103  96 - 112 mEq/L   CO2 24  19 - 32 mEq/L   Glucose, Bld 149 (*) 70 - 99 mg/dL   BUN 16  6 - 23 mg/dL   Creatinine, Ser 0.83  0.50 - 1.10 mg/dL   Calcium 9.4  8.4 - 10.5 mg/dL   Total Protein 7.0  6.0 - 8.3 g/dL   Albumin 3.8  3.5 - 5.2 g/dL   AST 23  0 - 37 U/L   ALT 19  0 - 35 U/L   Alkaline Phosphatase 71  39 - 117 U/L   Total Bilirubin 0.2 (*) 0.3 - 1.2 mg/dL   GFR calc non Af Amer 65 (*) >90 mL/min   GFR calc Af Amer 76 (*) >90  mL/min  CBC WITH DIFFERENTIAL      Result Value Ref Range   WBC 10.5  4.0 - 10.5 K/uL   RBC 4.52  3.87 - 5.11 MIL/uL   Hemoglobin 14.6  12.0 - 15.0 g/dL   HCT 41.6  36.0 - 46.0 %   MCV 92.0  78.0 - 100.0 fL   MCH 32.3  26.0 - 34.0 pg   MCHC 35.1  30.0 - 36.0 g/dL   RDW 12.4  11.5 - 15.5 %   Platelets 267  150 - 400 K/uL   Neutrophils Relative % 69  43 - 77 %   Neutro Abs 7.3  1.7 - 7.7 K/uL   Lymphocytes Relative 20  12 - 46 %   Lymphs Abs 2.1  0.7 - 4.0 K/uL   Monocytes Relative 7  3 - 12 %   Monocytes Absolute 0.7  0.1 - 1.0 K/uL   Eosinophils Relative 3  0 - 5 %   Eosinophils Absolute 0.3  0.0 - 0.7 K/uL   Basophils Relative 1  0 - 1 %   Basophils Absolute 0.1  0.0 - 0.1 K/uL  LIPASE, BLOOD      Result Value Ref Range   Lipase 32  11 - 59 U/L  URINALYSIS, ROUTINE W REFLEX MICROSCOPIC      Result Value Ref Range   Color, Urine YELLOW  YELLOW   APPearance CLEAR  CLEAR   Specific Gravity, Urine >1.030 (*) 1.005 - 1.030   pH 5.5  5.0 - 8.0   Glucose, UA NEGATIVE  NEGATIVE mg/dL   Hgb urine dipstick SMALL (*) NEGATIVE   Bilirubin Urine NEGATIVE  NEGATIVE   Ketones, ur NEGATIVE  NEGATIVE mg/dL   Protein, ur NEGATIVE  NEGATIVE mg/dL   Urobilinogen, UA 0.2  0.0 - 1.0 mg/dL   Nitrite NEGATIVE  NEGATIVE   Leukocytes, UA NEGATIVE  NEGATIVE  LACTIC ACID, PLASMA      Result Value Ref Range   Lactic Acid, Venous 1.8  0.5 - 2.2 mmol/L  URINE MICROSCOPIC-ADD ON      Result Value Ref Range   Squamous Epithelial / LPF MANY (*) RARE   WBC, UA 0-2  <3 WBC/hpf   RBC / HPF 7-10  <3 RBC/hpf   Bacteria, UA MANY (*) RARE   Crystals CA OXALATE CRYSTALS (*) NEGATIVE   Urine-Other MUCOUS PRESENT        EKG Interpretation   Date/Time:  Monday May 03 2013 23:41:09 EST Ventricular Rate:  62 PR Interval:  156 QRS Duration: 88 QT Interval:  402 QTC Calculation: 408 R Axis:   -19 Text Interpretation:  Normal sinus rhythm Normal ECG When compared with  ECG of 28-Aug-2012 08:49, No  significant change was found Confirmed by  Christy Gentles  MD, Nan Maya ([id number]) on 05/03/2013 11:58:03 PM     Medications  morphine 4 MG/ML injection 4 mg (4 mg Intravenous Given 05/03/13 2348)  ondansetron (ZOFRAN) injection 4 mg (4 mg Intravenous Given 05/03/13 2346)  iohexol (OMNIPAQUE) 300 MG/ML solution 25 mL (25 mLs Oral Contrast Given 05/04/13 0157)  iohexol (OMNIPAQUE) 300 MG/ML solution 100 mL (100 mLs Intravenous Contrast Given 05/04/13 0158)    MDM   Final diagnoses:  Abdominal pain    Nursing notes including past medical history and social history reviewed and considered in documentation Labs/vital reviewed and considered   I personally performed the services described in this documentation, which was scribed in my presence. The recorded information has been reviewed and is accurate.      Sharyon Cable, MD 05/04/13 [phone]

## 2013-05-03 NOTE — ED Notes (Signed)
I am having terrible abdominal pain. I have a history of pancreatitis, but I have not had a spell in around 15 years but it feels the same per pt. Patient denies diarrhea, but is having nausea without vomiting.

## 2013-05-04 ENCOUNTER — Emergency Department (HOSPITAL_COMMUNITY): Payer: Medicare Other

## 2013-05-04 DIAGNOSIS — N281 Cyst of kidney, acquired: Secondary | ICD-10-CM | POA: Diagnosis not present

## 2013-05-04 LAB — CBC WITH DIFFERENTIAL/PLATELET
BASOS ABS: 0.1 10*3/uL (ref 0.0–0.1)
BASOS PCT: 1 % (ref 0–1)
Eosinophils Absolute: 0.3 10*3/uL (ref 0.0–0.7)
Eosinophils Relative: 3 % (ref 0–5)
HCT: 41.6 % (ref 36.0–46.0)
HEMOGLOBIN: 14.6 g/dL (ref 12.0–15.0)
Lymphocytes Relative: 20 % (ref 12–46)
Lymphs Abs: 2.1 10*3/uL (ref 0.7–4.0)
MCH: 32.3 pg (ref 26.0–34.0)
MCHC: 35.1 g/dL (ref 30.0–36.0)
MCV: 92 fL (ref 78.0–100.0)
Monocytes Absolute: 0.7 10*3/uL (ref 0.1–1.0)
Monocytes Relative: 7 % (ref 3–12)
NEUTROS ABS: 7.3 10*3/uL (ref 1.7–7.7)
NEUTROS PCT: 69 % (ref 43–77)
Platelets: 267 10*3/uL (ref 150–400)
RBC: 4.52 MIL/uL (ref 3.87–5.11)
RDW: 12.4 % (ref 11.5–15.5)
WBC: 10.5 10*3/uL (ref 4.0–10.5)

## 2013-05-04 LAB — URINALYSIS, ROUTINE W REFLEX MICROSCOPIC
Bilirubin Urine: NEGATIVE
Glucose, UA: NEGATIVE mg/dL
Ketones, ur: NEGATIVE mg/dL
LEUKOCYTES UA: NEGATIVE
NITRITE: NEGATIVE
PROTEIN: NEGATIVE mg/dL
Specific Gravity, Urine: >1.03 — ABNORMAL HIGH (ref 1.005–1.030)
UROBILINOGEN UA: 0.2 mg/dL (ref 0.0–1.0)
pH: 5.5 (ref 5.0–8.0)

## 2013-05-04 LAB — COMPREHENSIVE METABOLIC PANEL
ALT: 19 U/L (ref 0–35)
AST: 23 U/L (ref 0–37)
Albumin: 3.8 g/dL (ref 3.5–5.2)
Alkaline Phosphatase: 71 U/L (ref 39–117)
BUN: 16 mg/dL (ref 6–23)
CALCIUM: 9.4 mg/dL (ref 8.4–10.5)
CO2: 24 meq/L (ref 19–32)
Chloride: 103 mEq/L (ref 96–112)
Creatinine, Ser: 0.83 mg/dL (ref 0.50–1.10)
GFR, EST AFRICAN AMERICAN: 76 mL/min — AB (ref >90)
GFR, EST NON AFRICAN AMERICAN: 65 mL/min — AB (ref >90)
GLUCOSE: 149 mg/dL — AB (ref 70–99)
Potassium: 4.1 mEq/L (ref 3.7–5.3)
SODIUM: 141 meq/L (ref 137–147)
Total Bilirubin: 0.2 mg/dL — ABNORMAL LOW (ref 0.3–1.2)
Total Protein: 7 g/dL (ref 6.0–8.3)

## 2013-05-04 LAB — LACTIC ACID, PLASMA: LACTIC ACID, VENOUS: 1.8 mmol/L (ref 0.5–2.2)

## 2013-05-04 LAB — URINE MICROSCOPIC-ADD ON

## 2013-05-04 LAB — LIPASE, BLOOD: Lipase: 32 U/L (ref 11–59)

## 2013-05-04 MED ORDER — IOHEXOL 300 MG/ML  SOLN
25.0000 mL | Freq: Once | INTRAMUSCULAR | Status: AC | PRN
  Administered 2013-05-04: 25 mL via ORAL

## 2013-05-04 MED ORDER — OXYCODONE-ACETAMINOPHEN 5-325 MG PO TABS: 1.0000 | ORAL_TABLET | ORAL | Status: DC | PRN

## 2013-05-04 MED ORDER — IOHEXOL 300 MG/ML  SOLN
100.0000 mL | Freq: Once | INTRAMUSCULAR | Status: AC | PRN
  Administered 2013-05-04: 100 mL via INTRAVENOUS

## 2013-05-04 NOTE — Discharge Instructions (Signed)

## 2013-05-05 LAB — URINE CULTURE

## 2013-08-09 DIAGNOSIS — M9981 Other biomechanical lesions of cervical region: Secondary | ICD-10-CM | POA: Diagnosis not present

## 2013-08-09 DIAGNOSIS — M4712 Other spondylosis with myelopathy, cervical region: Secondary | ICD-10-CM | POA: Diagnosis not present

## 2013-08-09 DIAGNOSIS — M546 Pain in thoracic spine: Secondary | ICD-10-CM | POA: Diagnosis not present

## 2013-08-09 DIAGNOSIS — M47817 Spondylosis without myelopathy or radiculopathy, lumbosacral region: Secondary | ICD-10-CM | POA: Diagnosis not present

## 2013-08-09 DIAGNOSIS — M999 Biomechanical lesion, unspecified: Secondary | ICD-10-CM | POA: Diagnosis not present

## 2013-08-11 DIAGNOSIS — M546 Pain in thoracic spine: Secondary | ICD-10-CM | POA: Diagnosis not present

## 2013-08-11 DIAGNOSIS — M47817 Spondylosis without myelopathy or radiculopathy, lumbosacral region: Secondary | ICD-10-CM | POA: Diagnosis not present

## 2013-08-11 DIAGNOSIS — M9981 Other biomechanical lesions of cervical region: Secondary | ICD-10-CM | POA: Diagnosis not present

## 2013-08-11 DIAGNOSIS — M4712 Other spondylosis with myelopathy, cervical region: Secondary | ICD-10-CM | POA: Diagnosis not present

## 2013-08-11 DIAGNOSIS — M999 Biomechanical lesion, unspecified: Secondary | ICD-10-CM | POA: Diagnosis not present

## 2013-08-16 DIAGNOSIS — M47817 Spondylosis without myelopathy or radiculopathy, lumbosacral region: Secondary | ICD-10-CM | POA: Diagnosis not present

## 2013-08-16 DIAGNOSIS — M9981 Other biomechanical lesions of cervical region: Secondary | ICD-10-CM | POA: Diagnosis not present

## 2013-08-16 DIAGNOSIS — M546 Pain in thoracic spine: Secondary | ICD-10-CM | POA: Diagnosis not present

## 2013-08-16 DIAGNOSIS — M999 Biomechanical lesion, unspecified: Secondary | ICD-10-CM | POA: Diagnosis not present

## 2013-08-16 DIAGNOSIS — M4712 Other spondylosis with myelopathy, cervical region: Secondary | ICD-10-CM | POA: Diagnosis not present

## 2013-08-19 DIAGNOSIS — M9981 Other biomechanical lesions of cervical region: Secondary | ICD-10-CM | POA: Diagnosis not present

## 2013-08-19 DIAGNOSIS — M999 Biomechanical lesion, unspecified: Secondary | ICD-10-CM | POA: Diagnosis not present

## 2013-08-19 DIAGNOSIS — M47817 Spondylosis without myelopathy or radiculopathy, lumbosacral region: Secondary | ICD-10-CM | POA: Diagnosis not present

## 2013-08-19 DIAGNOSIS — M546 Pain in thoracic spine: Secondary | ICD-10-CM | POA: Diagnosis not present

## 2013-08-19 DIAGNOSIS — M4712 Other spondylosis with myelopathy, cervical region: Secondary | ICD-10-CM | POA: Diagnosis not present

## 2013-08-24 ENCOUNTER — Ambulatory Visit (HOSPITAL_COMMUNITY)
Admission: RE | Admit: 2013-08-24 | Discharge: 2013-08-24 | Disposition: A | Discharge status: Home / Self Care | Payer: Medicare Other | Source: Ambulatory Visit | Attending: Family Medicine | Admitting: Family Medicine

## 2013-08-24 ENCOUNTER — Other Ambulatory Visit (HOSPITAL_COMMUNITY): Payer: Self-pay | Admitting: Family Medicine

## 2013-08-24 DIAGNOSIS — M25559 Pain in unspecified hip: Secondary | ICD-10-CM | POA: Diagnosis not present

## 2013-08-24 DIAGNOSIS — M5430 Sciatica, unspecified side: Secondary | ICD-10-CM | POA: Diagnosis not present

## 2013-08-24 DIAGNOSIS — M1610 Unilateral primary osteoarthritis, unspecified hip: Secondary | ICD-10-CM | POA: Insufficient documentation

## 2013-08-24 DIAGNOSIS — M169 Osteoarthritis of hip, unspecified: Secondary | ICD-10-CM | POA: Insufficient documentation

## 2013-08-24 DIAGNOSIS — Z6838 Body mass index (BMI) 38.0-38.9, adult: Secondary | ICD-10-CM | POA: Diagnosis not present

## 2013-08-24 DIAGNOSIS — M25551 Pain in right hip: Secondary | ICD-10-CM

## 2013-09-06 ENCOUNTER — Encounter: Payer: Self-pay | Admitting: Orthopedic Surgery

## 2013-09-06 ENCOUNTER — Ambulatory Visit (INDEPENDENT_AMBULATORY_CARE_PROVIDER_SITE_OTHER): Payer: Medicare Other | Admitting: Orthopedic Surgery

## 2013-09-06 ENCOUNTER — Ambulatory Visit (HOSPITAL_COMMUNITY)
Admission: RE | Admit: 2013-09-06 | Discharge: 2013-09-06 | Disposition: A | Discharge status: Home / Self Care | Payer: Medicare Other | Source: Ambulatory Visit | Attending: Orthopedic Surgery | Admitting: Orthopedic Surgery

## 2013-09-06 VITALS — BP 132/92 | Ht 65.0 in | Wt 220.0 lb

## 2013-09-06 DIAGNOSIS — M1610 Unilateral primary osteoarthritis, unspecified hip: Secondary | ICD-10-CM | POA: Diagnosis not present

## 2013-09-06 DIAGNOSIS — M5137 Other intervertebral disc degeneration, lumbosacral region: Secondary | ICD-10-CM

## 2013-09-06 DIAGNOSIS — M160 Bilateral primary osteoarthritis of hip: Secondary | ICD-10-CM

## 2013-09-06 DIAGNOSIS — M51369 Other intervertebral disc degeneration, lumbar region without mention of lumbar back pain or lower extremity pain: Secondary | ICD-10-CM

## 2013-09-06 DIAGNOSIS — M5136 Other intervertebral disc degeneration, lumbar region: Secondary | ICD-10-CM

## 2013-09-06 DIAGNOSIS — M47817 Spondylosis without myelopathy or radiculopathy, lumbosacral region: Secondary | ICD-10-CM | POA: Insufficient documentation

## 2013-09-06 DIAGNOSIS — Q762 Congenital spondylolisthesis: Secondary | ICD-10-CM | POA: Insufficient documentation

## 2013-09-06 HISTORY — DX: Other intervertebral disc degeneration, lumbar region: M51.36

## 2013-09-06 HISTORY — DX: Bilateral primary osteoarthritis of hip: M16.0

## 2013-09-06 HISTORY — DX: Other intervertebral disc degeneration, lumbar region without mention of lumbar back pain or lower extremity pain: M51.369

## 2013-09-06 MED ORDER — HYDROCODONE-ACETAMINOPHEN 5-325 MG PO TABS: 1.0000 | ORAL_TABLET | Freq: Four times a day (QID) | ORAL | Status: DC | PRN

## 2013-09-06 NOTE — Progress Notes (Signed)
Patient ID: [name], female   DOB: 1931/04/05, 80 y.o.   MRN: [id number]  Chief Complaint  Patient presents with  . Hip Pain    Right Hip avascular necrosis. Referred by Dr. Hilma Favors    80 year old female referred for "bilateral hip pain possible avascular necrosis.  The patient reports pain in her lower back and right hip and weakness in her right leg for the last 4 months responsive to prednisone but unresponsive to Aleve no other treatment. Pain is 10 out of 10 constant. She notices more difficulty ambulating, shopping with weakness in the right lower chest remedy and the pain she were reports it has hip pain but points to her lower back.  Her review of systems his night sweats tinnitus sinus problems blood clots venous stasis disease leg swelling joint pain psoriasis cataracts otherwise normal  She still drives walks with a cane now that does all of her own shopping housework etc.  Medical history of DVT hypertension psoriasis history of fracture in the wrist arthritis  Status post hysterectomy nasal reconstruction tonsillectomy left knee meniscectomy right wrist surgery breast tumors which were benign  Current medications are metoprolol 100 mg a day one Aleve a day low-dose aspirin over-the-counter Castro then and prednisone  Family history diabetes hypertension heart disease and stroke previous heart attack noted in her family history arthritis osteoporosis  No known drug allergies   Vital signs are stable as recorded, BP 132/92  Ht 5\' 5"  (1.651 m)  Wt 220 lb (99.791 kg)  BMI 36.61 kg/m2                                                                   General appearance is normal, body habitus mild obesity and poor  The patient is alert and oriented x 3  The patient's mood and affect are normal  Gait assessment:  gait pattern 4 stride length on the right for hip flexion poor knee flexion decreased stride length and cadence                                                                                The cardiovascular exam reveals normal pulses and temperature mild to moderate edema and mild to moderate swelling The lymphatic system is negative for palpable lymph nodes  The sensory exam is normal.  There are no pathologic reflexes.  Balance is normal.   Exam of the upper extremities reveal normal alignment range of motion stability and strength skin normal Lumbar spine tenderness bilateral and midline. Inspection right hip no groin pain or tenderness with hip flexion obligatory external rotation with hip flexion which is limited but not painful. Log roll negative for pain. Stability normal. Strength in the right lower extremity seemed normal. She has skin changes from venous stasis disease severe both legs  Left hip no groin pain normal log roll mild hip external rotation with hip flexion which is limited but not painful. Stability and strength  normal. Again skin changes of venous stasis severe   A/P X-rays of the hips show severe arthritis of the hip on the right moderate to severe on the left. She had a CT scan which showed severe lumbar disc disease    Encounter Diagnoses  Name Primary?  . Degeneration of intervertebral disc of lumbar region   . Bilateral hip joint arthritis Yes    We will start with lumbar spine films and then we will progress to an MRI and then a consult with Dr. Carloyn Manner for advice on the lumbar spine  The right hip will need to be replaced and in the left hip may need to be replaced at a future date  Meds ordered this encounter  Medications  . HYDROcodone-acetaminophen (NORCO/VICODIN) 5-325 MG per tablet    Sig: Take 1 tablet by mouth every 6 (six) hours as needed for moderate pain.    Dispense:  56 tablet    Refill:  0    Orders Placed This Encounter  Procedures  . DG Lumbar Spine 2-3 Views    Standing Status: Future     Number of Occurrences:      Standing Expiration  Date: 11/05/2014    Order Specific Question:  Reason for Exam (SYMPTOM  OR DIAGNOSIS REQUIRED)    Answer:  degenerative disc disease    Order Specific Question:  Preferred imaging location?    Answer:  [facility]

## 2013-09-07 ENCOUNTER — Telehealth: Payer: Self-pay | Admitting: Orthopedic Surgery

## 2013-09-07 NOTE — Telephone Encounter (Signed)
Routing to Carol to schedule 

## 2013-09-07 NOTE — Telephone Encounter (Signed)
23rd in the afternoon

## 2013-09-07 NOTE — Telephone Encounter (Signed)
As per office visit of 09/06/13, patient had the spine Xrays.  Please contact her regarding results, and regarding "next steps."  She is eagerly awaiting what needs to happen next for her hip and her back, due to extent of discomfort.  Her ph#s are:  [phone] [facility]) / [phone] [facility])

## 2013-09-07 NOTE — Telephone Encounter (Signed)
Severe arthritis and disc disease lumbar spine   i ll see her when i get back on .Marland KitchenMarland Kitchen?

## 2013-09-13 NOTE — Telephone Encounter (Signed)
I have been calling patient since date of this note (09/07/13) at both phone #'s; no answer at either #; also, neither ph# has a voice mail set up.  Will continue trying.

## 2013-09-13 NOTE — Telephone Encounter (Signed)
In reviewing, appears that Tammy Long, LPN had already scheduled patient for 09/21/13, and spoke directly with patient.

## 2013-09-21 ENCOUNTER — Encounter: Payer: Self-pay | Admitting: Orthopedic Surgery

## 2013-09-21 ENCOUNTER — Ambulatory Visit (INDEPENDENT_AMBULATORY_CARE_PROVIDER_SITE_OTHER): Payer: Medicare Other | Admitting: Orthopedic Surgery

## 2013-09-21 VITALS — BP 142/66 | Ht 65.0 in | Wt 220.0 lb

## 2013-09-21 DIAGNOSIS — M48061 Spinal stenosis, lumbar region without neurogenic claudication: Secondary | ICD-10-CM | POA: Diagnosis not present

## 2013-09-21 DIAGNOSIS — M5136 Other intervertebral disc degeneration, lumbar region: Secondary | ICD-10-CM

## 2013-09-21 DIAGNOSIS — M1610 Unilateral primary osteoarthritis, unspecified hip: Secondary | ICD-10-CM | POA: Diagnosis not present

## 2013-09-21 DIAGNOSIS — M5137 Other intervertebral disc degeneration, lumbosacral region: Secondary | ICD-10-CM

## 2013-09-21 DIAGNOSIS — M160 Bilateral primary osteoarthritis of hip: Secondary | ICD-10-CM

## 2013-09-21 NOTE — Patient Instructions (Addendum)
We will schedule open MRI at [place] for you and call you with appointment We will call you with results

## 2013-09-21 NOTE — Progress Notes (Signed)
Patient ID: [name], female   DOB: [birth year], 80 y.o.   MRN: [id number] Chief Complaint  Patient presents with  . Follow-up    follow up and review L spine xrays   BP 142/66  Ht 5\' 5"  (1.651 m)  Wt 220 lb (99.791 kg)  BMI 36.61 kg/m2 Hip pain back pain unclear primary etiology. X-rays of the hips look bad needs hip replacement  The patient has had oral pain medication over-the-counter NSAIDs. She's not walking well she is very weak. She has radicular symptoms.  Review of systems bowel and bladder function intact  Stable vital signs oriented x3 mood normal general appearance normal she's and living with 2 canes she has lumbar spine tenderness and decreased range of motion she has decreased range of motion in the right hip as well.   Encounter Diagnoses  Name Primary?  . Spinal stenosis of lumbar region Yes  . Degeneration of intervertebral disc of lumbar region   . Bilateral hip joint arthritis      X-rays report FINDINGS: There is no evidence of acute fracture or dislocation. Degenerative disc disease changes throughout the lumbar spine most prominent at the L4-5 and L5-S1. Mild grade 1 anterolisthesis at L4-5. Multilevel facet arthropathy throughout the cervical spine most prominent at L4-5 and L5-S1.   IMPRESSION: Multilevel spondylosis most prominent at L4-5 and L5-S1. No acute osseous abnormalities. Grade 1 anterolisthesis at L4-5 likely secondary to degenerative changes.  X-ray review lumbar spine shows multilevel spondylosis which accounts for the patient's back pain and radicular symptoms.  She has x-rays documenting osteoarthritis of the hip which accounts for her groin pain.  MRI of the spine. I want to give her some epidural shots before her hip surgery.

## 2013-09-27 ENCOUNTER — Ambulatory Visit
Admission: RE | Admit: 2013-09-27 | Discharge: 2013-09-27 | Disposition: A | Discharge status: Home / Self Care | Payer: Medicare Other | Source: Ambulatory Visit | Attending: Orthopedic Surgery | Admitting: Orthopedic Surgery

## 2013-09-27 ENCOUNTER — Telehealth: Payer: Self-pay | Admitting: Orthopedic Surgery

## 2013-09-27 DIAGNOSIS — M48061 Spinal stenosis, lumbar region without neurogenic claudication: Secondary | ICD-10-CM

## 2013-09-27 DIAGNOSIS — M47817 Spondylosis without myelopathy or radiculopathy, lumbosacral region: Secondary | ICD-10-CM | POA: Diagnosis not present

## 2013-09-27 DIAGNOSIS — M4310 Spondylolisthesis, site unspecified: Secondary | ICD-10-CM | POA: Diagnosis not present

## 2013-09-27 NOTE — Telephone Encounter (Signed)
Patient called, states just completed her MRI at [place], and is calling for results, as she states Dr Aline Brochure advised.  Her cell phone # is [phone], and home # is [phone].

## 2013-09-30 ENCOUNTER — Telehealth: Payer: Self-pay | Admitting: *Deleted

## 2013-09-30 NOTE — Telephone Encounter (Signed)
Patient called and wanted to know if she could come in to see Dr. Aline Brochure to have her legs wrapped for her venous stasis. I advised her that Dr. Aline Brochure did not treat this, and advised she see her primary care physician or go to the ER.

## 2013-10-04 NOTE — Telephone Encounter (Signed)
Monday, 10/04/13, patient called back - states has not heard back about her MRI results.  Ph#'s are (cell) [phone], (home) [phone].

## 2013-10-07 NOTE — Telephone Encounter (Signed)
Please relay she has spinal stenosis Narrowing of the spinal column with pinching of the spinal cord  She has to be referred to neurosurgery

## 2013-10-08 ENCOUNTER — Other Ambulatory Visit: Payer: Self-pay | Admitting: *Deleted

## 2013-10-08 ENCOUNTER — Telehealth: Payer: Self-pay | Admitting: *Deleted

## 2013-10-08 DIAGNOSIS — M48061 Spinal stenosis, lumbar region without neurogenic claudication: Secondary | ICD-10-CM

## 2013-10-08 NOTE — Telephone Encounter (Signed)
REFERRAL SENT TO DR Carloyn Manner

## 2013-10-08 NOTE — Telephone Encounter (Signed)
Patient aware and requests Dr Carloyn Manner in [place], referral sent

## 2013-10-11 NOTE — Telephone Encounter (Signed)
Referral to Dr. Vania Rea Tuckers recommended

## 2013-10-11 NOTE — Telephone Encounter (Signed)
Noted. Patient also called back today, Monday, 10/11/13, to relay that she did also receive a call from Dr. Aline Brochure personally, as per note routed to nurse.

## 2013-10-14 ENCOUNTER — Encounter (HOSPITAL_COMMUNITY): Payer: Self-pay | Admitting: Emergency Medicine

## 2013-10-14 ENCOUNTER — Emergency Department (HOSPITAL_COMMUNITY): Payer: Medicare Other

## 2013-10-14 ENCOUNTER — Emergency Department (HOSPITAL_COMMUNITY)
Admission: EM | Admit: 2013-10-14 | Discharge: 2013-10-14 | Disposition: A | Discharge status: Home / Self Care | Payer: Medicare Other | Attending: Emergency Medicine | Admitting: Emergency Medicine

## 2013-10-14 DIAGNOSIS — Y9389 Activity, other specified: Secondary | ICD-10-CM | POA: Insufficient documentation

## 2013-10-14 DIAGNOSIS — R296 Repeated falls: Secondary | ICD-10-CM | POA: Insufficient documentation

## 2013-10-14 DIAGNOSIS — Z79899 Other long term (current) drug therapy: Secondary | ICD-10-CM | POA: Insufficient documentation

## 2013-10-14 DIAGNOSIS — I10 Essential (primary) hypertension: Secondary | ICD-10-CM | POA: Insufficient documentation

## 2013-10-14 DIAGNOSIS — S8990XA Unspecified injury of unspecified lower leg, initial encounter: Secondary | ICD-10-CM | POA: Insufficient documentation

## 2013-10-14 DIAGNOSIS — R6889 Other general symptoms and signs: Secondary | ICD-10-CM | POA: Diagnosis not present

## 2013-10-14 DIAGNOSIS — Y929 Unspecified place or not applicable: Secondary | ICD-10-CM | POA: Insufficient documentation

## 2013-10-14 DIAGNOSIS — Z7982 Long term (current) use of aspirin: Secondary | ICD-10-CM | POA: Diagnosis not present

## 2013-10-14 DIAGNOSIS — S99929A Unspecified injury of unspecified foot, initial encounter: Secondary | ICD-10-CM

## 2013-10-14 DIAGNOSIS — S8000XA Contusion of unspecified knee, initial encounter: Secondary | ICD-10-CM | POA: Insufficient documentation

## 2013-10-14 DIAGNOSIS — T148XXA Other injury of unspecified body region, initial encounter: Secondary | ICD-10-CM | POA: Diagnosis not present

## 2013-10-14 DIAGNOSIS — S99919A Unspecified injury of unspecified ankle, initial encounter: Secondary | ICD-10-CM | POA: Diagnosis not present

## 2013-10-14 MED ORDER — BACITRACIN-NEOMYCIN-POLYMYXIN 400-5-5000 EX OINT
TOPICAL_OINTMENT | Freq: Once | CUTANEOUS | Status: AC
  Administered 2013-10-14: 1 via TOPICAL
  Filled 2013-10-14: qty 1

## 2013-10-14 NOTE — ED Provider Notes (Signed)
CSN: [id number]     Arrival date & time 10/14/13  1337 History   First MD Initiated Contact with Patient 10/14/13 1415     Chief Complaint  Patient presents with  . Fall     (Consider location/radiation/quality/duration/timing/severity/associated sxs/prior Treatment) HPI Comments: Patient presents today for evaluation of left knee injury. Patient reports that she was getting out of a car and tripped over something and fell. She landed on the left knee and has pain and swelling of the knee she did not have her head. No loss of consciousness. No neck or back pain. Patient given IV pain medicine by EMS with improvement.  Patient is a 80 y.o. female presenting with fall.  Fall    Past Medical History  Diagnosis Date  . Hypertension    Past Surgical History  Procedure Laterality Date  . Closed redution external fixation right wrist Right   . Appendectomy    . Abdominal hysterectomy    . Tonsillectomy    . Breast biopsy    . Cataract extraction w/phaco Right 09/03/2012    Procedure: CATARACT EXTRACTION PHACO AND INTRAOCULAR LENS PLACEMENT (IOC);  Surgeon: Tonny Branch, MD;  Location: [facility];  Service: Ophthalmology;  Laterality: Right;  CDE: 12.37  . Cataract extraction w/phaco Left 09/28/2012    Procedure: CATARACT EXTRACTION PHACO AND INTRAOCULAR LENS PLACEMENT (IOC);  Surgeon: Tonny Branch, MD;  Location: [facility];  Service: Ophthalmology;  Laterality: Left;  CDE 18.52   History reviewed. No pertinent family history. History  Substance Use Topics  . Smoking status: Never Smoker   . Smokeless tobacco: Not on file  . Alcohol Use: No   OB History   Grav Para Term Preterm Abortions TAB SAB Ect Mult Living                 Review of Systems  Musculoskeletal: Positive for arthralgias. Negative for back pain.  Neurological: Negative for syncope.  All other systems reviewed and are negative.     Allergies  Review of patient's allergies indicates no known allergies.  Home  Medications   Prior to Admission medications   Medication Sig Start Date End Date Taking? Authorizing Provider  aspirin EC 81 MG tablet Take 81 mg by mouth daily.   Yes Historical Provider, MD  HYDROcodone-acetaminophen (NORCO/VICODIN) 5-325 MG per tablet Take 1 tablet by mouth every 6 (six) hours as needed for moderate pain or severe pain.   Yes Historical Provider, MD  metoprolol succinate (TOPROL-XL) 100 MG 24 hr tablet Take 100 mg by mouth daily. Take with or immediately following a meal.   Yes Historical Provider, MD  Nutritional Supplements (ESTROVEN PO) Take 1 tablet by mouth daily.   Yes Historical Provider, MD   BP 159/88 Physical Exam  Constitutional: She is oriented to person, place, and time. She appears well-developed and well-nourished. No distress.  HENT:  Head: Normocephalic and atraumatic.  Right Ear: Hearing normal.  Left Ear: Hearing normal.  Nose: Nose normal.  Mouth/Throat: Oropharynx is clear and moist and mucous membranes are normal.  Eyes: Conjunctivae and EOM are normal. Pupils are equal, round, and reactive to light.  Neck: Normal range of motion. Neck supple.  Cardiovascular: Regular rhythm, S1 normal and S2 normal.  Exam reveals no gallop and no friction rub.   No murmur heard. Pulmonary/Chest: Effort normal and breath sounds normal. No respiratory distress. She exhibits no tenderness.  Abdominal: Soft. Normal appearance and bowel sounds are normal. There is no hepatosplenomegaly. There is no  tenderness. There is no rebound, no guarding, no tenderness at McBurney's point and negative Murphy's sign. No hernia.  Musculoskeletal: Normal range of motion.       Left knee: She exhibits swelling. She exhibits no effusion and no deformity. Tenderness found.       Legs: Neurological: She is alert and oriented to person, place, and time. She has normal strength. No cranial nerve deficit or sensory deficit. Coordination normal. GCS eye subscore is 4. GCS verbal subscore is  5. GCS motor subscore is 6.  Skin: Skin is warm, dry and intact. No laceration and no rash noted. No cyanosis or erythema.     Psychiatric: She has a normal mood and affect. Her speech is normal and behavior is normal. Thought content normal.    ED Course  Procedures (including critical care time) Labs Review Labs Reviewed - No data to display  Imaging Review Dg Knee Complete 4 Views Left  10/14/2013   CLINICAL DATA:  Anterior left knee pain and bruising status post trauma  EXAM: LEFT KNEE - COMPLETE 4+ VIEW  COMPARISON:  None.  FINDINGS: The bones are adequately mineralized for age. There is no acute fracture nor dislocation. There is mild to moderate narrowing of the medial and lateral joint compartments. There is beaking of the tibial spines. There are marginal osteophytes from the periphery of the medial and lateral femoral condyles and the medial tibial condyle. There are small osteophytes from the superior and inferior margins of the patella. There is no joint effusion.  IMPRESSION: There is moderate degenerative change of the left knee involving principally the medial and lateral joint compartments. There is no acute fracture nor dislocation.   Electronically Signed   By: David  Martinique   On: 10/14/2013 15:00     EKG Interpretation None      MDM   Final diagnoses:  None  Contusion  Patient presents to the ER with complaints of left knee pain. She suffered a mechanical fall just prior to arrival. Patient did have a contusion just below the left knee. X-ray of the knee, however, does not show any obvious fracture or abnormality. Patient will continue treatment with rest, ice, elevation. She has pain medication, hydrocodone, homogeneous and normally use. She can use of her pain. She sees Doctor Aline Brochure.  Orpah Greek, MD 10/14/13 [phone]

## 2013-10-14 NOTE — Discharge Instructions (Signed)
Contusion °A contusion is a deep bruise. Contusions happen when an injury causes bleeding under the skin. Signs of bruising include pain, puffiness (swelling), and discolored skin. The contusion may turn blue, purple, or yellow. °HOME CARE  °· Put ice on the injured area. °¨ Put ice in a plastic bag. °¨ Place a towel between your skin and the bag. °¨ Leave the ice on for 15-20 minutes, 03-04 times a day. °· Only take medicine as told by your doctor. °· Rest the injured area. °· If possible, raise (elevate) the injured area to lessen puffiness. °GET HELP RIGHT AWAY IF:  °· You have more bruising or puffiness. °· You have pain that is getting worse. °· Your puffiness or pain is not helped by medicine. °MAKE SURE YOU:  °· Understand these instructions. °· Will watch your condition. °· Will get help right away if you are not doing well or get worse. °Document Released: 08/07/2007 Document Revised: 05/13/2011 Document Reviewed: 12/24/2010 °ExitCare® Patient Information ©2015 [employer]. This information is not intended to replace advice given to you by your health care provider. Make sure you discuss any questions you have with your health care provider. ° °Cryotherapy °Cryotherapy means treatment with cold. Ice or gel packs can be used to reduce both pain and swelling. Ice is the most helpful within the first 24 to 48 hours after an injury or flare-up from overusing a muscle or joint. Sprains, strains, spasms, burning pain, shooting pain, and aches can all be eased with ice. Ice can also be used when recovering from surgery. Ice is effective, has very few side effects, and is safe for most people to use. °PRECAUTIONS  °Ice is not a safe treatment option for people with: °· Raynaud phenomenon. This is a condition affecting small blood vessels in the extremities. Exposure to cold may cause your problems to return. °· Cold hypersensitivity. There are many forms of cold hypersensitivity, including: °¨ Cold urticaria.  Red, itchy hives appear on the skin when the tissues begin to warm after being iced. °¨ Cold erythema. This is a red, itchy rash caused by exposure to cold. °¨ Cold hemoglobinuria. Red blood cells break down when the tissues begin to warm after being iced. The hemoglobin that carry oxygen are passed into the urine because they cannot combine with blood proteins fast enough. °· Numbness or altered sensitivity in the area being iced. °If you have any of the following conditions, do not use ice until you have discussed cryotherapy with your caregiver: °· Heart conditions, such as arrhythmia, angina, or chronic heart disease. °· High blood pressure. °· Healing wounds or open skin in the area being iced. °· Current infections. °· Rheumatoid arthritis. °· Poor circulation. °· Diabetes. °Ice slows the blood flow in the region it is applied. This is beneficial when trying to stop inflamed tissues from spreading irritating chemicals to surrounding tissues. However, if you expose your skin to cold temperatures for too long or without the proper protection, you can damage your skin or nerves. Watch for signs of skin damage due to cold. °HOME CARE INSTRUCTIONS °Follow these tips to use ice and cold packs safely. °· Place a dry or damp towel between the ice and skin. A damp towel will cool the skin more quickly, so you may need to shorten the time that the ice is used. °· For a more rapid response, add gentle compression to the ice. °· Ice for no more than 10 to 20 minutes at a time.   The bonier the area you are icing, the less time it will take to get the benefits of ice. °· Check your skin after 5 minutes to make sure there are no signs of a poor response to cold or skin damage. °· Rest 20 minutes or more between uses. °· Once your skin is numb, you can end your treatment. You can test numbness by very lightly touching your skin. The touch should be so light that you do not see the skin dimple from the pressure of your  fingertip. When using ice, most people will feel these normal sensations in this order: cold, burning, aching, and numbness. °· Do not use ice on someone who cannot communicate their responses to pain, such as small children or people with dementia. °HOW TO MAKE AN ICE PACK °Ice packs are the most common way to use ice therapy. Other methods include ice massage, ice baths, and cryosprays. Muscle creams that cause a cold, tingly feeling do not offer the same benefits that ice offers and should not be used as a substitute unless recommended by your caregiver. °To make an ice pack, do one of the following: °· Place crushed ice or a bag of frozen vegetables in a sealable plastic bag. Squeeze out the excess air. Place this bag inside another plastic bag. Slide the bag into a pillowcase or place a damp towel between your skin and the bag. °· Mix 3 parts water with 1 part rubbing alcohol. Freeze the mixture in a sealable plastic bag. When you remove the mixture from the freezer, it will be slushy. Squeeze out the excess air. Place this bag inside another plastic bag. Slide the bag into a pillowcase or place a damp towel between your skin and the bag. °SEEK MEDICAL CARE IF: °· You develop white spots on your skin. This may give the skin a blotchy (mottled) appearance. °· Your skin turns blue or pale. °· Your skin becomes waxy or hard. °· Your swelling gets worse. °MAKE SURE YOU:  °· Understand these instructions. °· Will watch your condition. °· Will get help right away if you are not doing well or get worse. °Document Released: 10/15/2010 Document Revised: 07/05/2013 Document Reviewed: 10/15/2010 °ExitCare® Patient Information ©2015 [employer]. This information is not intended to replace advice given to you by your health care provider. Make sure you discuss any questions you have with your health care provider. ° °

## 2013-10-14 NOTE — ED Notes (Signed)
Fall out of car.  Pain to left knee.  Left knee swollen.   6mg  morphine,  4mg  zofran given by ems.  VSS.

## 2013-10-18 ENCOUNTER — Ambulatory Visit (HOSPITAL_COMMUNITY)
Admission: RE | Admit: 2013-10-18 | Discharge: 2013-10-18 | Disposition: A | Discharge status: Home / Self Care | Payer: Medicare Other | Source: Ambulatory Visit | Attending: Family Medicine | Admitting: Family Medicine

## 2013-10-18 ENCOUNTER — Telehealth: Payer: Self-pay | Admitting: Orthopedic Surgery

## 2013-10-18 DIAGNOSIS — M7989 Other specified soft tissue disorders: Secondary | ICD-10-CM | POA: Diagnosis not present

## 2013-10-18 DIAGNOSIS — S81009A Unspecified open wound, unspecified knee, initial encounter: Secondary | ICD-10-CM | POA: Insufficient documentation

## 2013-10-18 DIAGNOSIS — S81809A Unspecified open wound, unspecified lower leg, initial encounter: Secondary | ICD-10-CM

## 2013-10-18 DIAGNOSIS — IMO0001 Reserved for inherently not codable concepts without codable children: Secondary | ICD-10-CM | POA: Insufficient documentation

## 2013-10-18 DIAGNOSIS — S91009A Unspecified open wound, unspecified ankle, initial encounter: Secondary | ICD-10-CM

## 2013-10-18 NOTE — Telephone Encounter (Signed)
Patient called to relay to Dr Aline Brochure that she was seen at [facility] 10/14/13, status/post fall at home; states was told knee contusion; said feeling better, but requests Dr Aline Brochure to be aware of Xray done; asking if he will review it, as she told physician he is her orthopedic surgeon.  Her ph# [phone] [facility])

## 2013-10-18 NOTE — Progress Notes (Signed)
Physical Therapy - Wound Therapy  Evaluation   Patient Details  Name: DANILLE OPPEDISANO MRN: [id number] Date of Birth: 03-19-1931  Today's Date: 10/18/2013 Time: 1751-0258 Time Calculation (min): 60 min Charge Evaluation  Visit#: 1 (Pt to be seen 3x week x 2 week followed by 2 x week x 2 weeks ) of 10  Re-eval: 11/17/13  Subjective Subjective Assessment Subjective: Pt states that her Rt LEstarted swelling and weeping approximately one month ago.  She tried to get into therapy but was told that there was no appointments.  Last Thursday she fell in her garage landing on her Lt knee.  This started her Lt LE to swell and to begin to weep as well She is now being referred to therapy to manage the edema and weeping of her LE.  Patient and Family Stated Goals: LE to not be as swollen nor for there to be any draining from them  Date of Onset: 09/16/13  Pain Assessment Pain Assessment Pain Score: 8   Wound Therapy Wound / Incision (Open or Dehisced) 10/18/13 Other (Comment) Leg Right;Left both LE have multiple small open areas that are weeping fluid  (Active)  Dressing Type Compression wrap;Alginate 10/18/2013  5:05 PM  Dressing Changed Changed 10/18/2013  5:05 PM  Dressing Status Clean;Old drainage 10/18/2013  5:05 PM  Dressing Change Frequency Monday, Wednesday, Friday 10/18/2013  5:05 PM  Site / Wound Assessment Clean 10/18/2013  5:05 PM  Peri-wound Assessment Edema;Induration 10/18/2013  5:05 PM  Drainage Amount Copious 10/18/2013  5:05 PM  Drainage Description Serous;No odor 10/18/2013  5:05 PM  Treatment Cleansed;Other (Comment) 10/18/2013  5:05 PM  Circumference measurement:   MTP             Rt  25.2               Lt  24.5 Ankle    33.5                    37.0 lg calf                 50.9       51.1     Physical Therapy Assessment and Plan Wound Therapy - Assess/Plan/Recommendations Wound Therapy - Clinical Statement: Pt is an 80 yo female who has been having increased edema and  drainage from her LE. The edema is making walking increasingly difficult.  Ms. Averitt has been referred to PT to address her edema and weeping LE to restore her to prior functional level.  Factors Delaying/Impairing Wound Healing: Multiple medical problems;Vascular compromise Hydrotherapy Plan: Dressing change;Patient/family education Wound Therapy - Frequency: 3X / week Wound Therapy - Current Recommendations: PT Wound Plan: Pt to recieve manual treatment to decrease edema followed by profore wrap with alginate; assess if two sheets of alginate per leg is enough to keep LE from becoming macerated.       Goals STG:  2  Weeks  Wound Therapy Goals - Improve the function of patient's integumentary system by progressing the wound(s) through the phases of wound healing by: Improve Drainage Characteristics:   From copious to minimal to moderate Patient/Family will be able to : verbalize the importance of using compression stockings  Patient/Family Instruction Goal - Progress: Goal set today LTG 4 weeks Pt to state that pain is no greater than a 3/10 Drainage: none Additional Wound Therapy Goal: Pt Swelling to decrease by 1.5 cm at MTP; 3 cm at ankle and 2cm at lg part of  calf to make walking easier.  Additional Wound Therapy Goal - Progress: Goal set today Goals/treatment plan/discharge plan were made with and agreed upon by patient/family:   Time For Goal Achievement:  (4 week) Wound Therapy - Potential for Goals: Good  Problem List Patient Active Problem List   Diagnosis Date Noted  . Bilateral hip joint arthritis 09/06/2013  . Degeneration of intervertebral disc of lumbar region 09/06/2013  . BACK PAIN 01/24/2009  . CLOSED FRACTURE OF METATARSAL BONE 04/20/2007    GP Functional Assessment Tool Used: clinical judgement  Functional Limitation: Other PT primary Other PT Primary Current Status (L5726): At least 60 percent but less than 80 percent impaired, limited or restricted Other PT  Primary Goal Status (O0355): At least 1 percent but less than 20 percent impaired, limited or restricted  RUSSELL,CINDY 10/18/2013, 5:18 PM Your signature is required to indicate approval of the treatment plan as stated above.  Please sign and return making a copy for your files.  You may hard copy or send electronically.  Please check one: ___1.  Approve of this plan  ___2.  Approve of this plan with the following changes.   ____________________________                             _____________ Physician                                                                      Date

## 2013-10-19 NOTE — Telephone Encounter (Signed)
Reviewed  No issues

## 2013-10-20 ENCOUNTER — Ambulatory Visit (HOSPITAL_COMMUNITY)
Admission: RE | Admit: 2013-10-20 | Discharge: 2013-10-20 | Disposition: A | Discharge status: Home / Self Care | Payer: Medicare Other | Source: Ambulatory Visit | Attending: Family Medicine | Admitting: Family Medicine

## 2013-10-20 DIAGNOSIS — IMO0001 Reserved for inherently not codable concepts without codable children: Secondary | ICD-10-CM | POA: Diagnosis not present

## 2013-10-20 NOTE — Telephone Encounter (Signed)
SHE NEEDS TO DISCUSS WITH DR ROY OR REFER TO ANOTHER NEUROSURGERY

## 2013-10-20 NOTE — Telephone Encounter (Signed)
Patient has appointment with Dr Carloyn Manner February 16 2014 @ 9:45am

## 2013-10-20 NOTE — Telephone Encounter (Signed)
Patient called upset, she received appointment with Dr Carloyn Manner but not until December. Patient wants Dr Aline Brochure to PLEASE see if he can work on her hips first because she doesn't think she can take it until December

## 2013-10-20 NOTE — Progress Notes (Signed)
Physical Therapy - Wound Therapy  Treatment   Patient Details  Name: [name] MRN: [id number] Date of Birth: 1931-12-26  Today's Date: 10/20/2013 Time: 0530-0625 Time Calculation (min): 55 min  Charges: Manual therapy 540-550 Visit#: 2 of 10  Re-eval: 11/17/13  Subjective Subjective Assessment Subjective: patient notes contineud difficulty walking, gettign in and out of car and dififculty putting on shoes secondary to pain in hip and swelling in bilateral Lower legs.  Date of Onset: 09/16/13  Pain Assessment Pain Assessment Pain Assessment: 0-10 Pain Score: 7   Wound Therapy Wound / Incision (Open or Dehisced) 10/18/13 Other (Comment) Leg Right;Left both LE have multiple small open areas that are weeping fluid  (Active)  Dressing Type Compression wrap;Alginate 10/20/2013  6:52 PM  Dressing Changed Changed 10/20/2013  6:52 PM  Dressing Status Clean;Old drainage 10/20/2013  6:52 PM  Dressing Change Frequency Monday, Wednesday, Friday 10/20/2013  6:52 PM  Site / Wound Assessment Clean 10/20/2013  6:52 PM  Peri-wound Assessment Edema;Induration 10/20/2013  6:52 PM  Drainage Amount Copious 10/20/2013  6:52 PM  Drainage Description Serous;No odor 10/20/2013  6:52 PM  Treatment Cleansed 10/20/2013  6:52 PM   Physical Therapy Assessment and Plan Wound Therapy - Assess/Plan/Recommendations Wound Therapy - Clinical Statement: patient dispclays improving thoguh continued edema in bilateral LE secondary to a fall and venous stasis resulting in delayed wound healing. patient displays signifnctly improved wound with drainage still copious but from fewer locations.  Factors Delaying/Impairing Wound Healing: Multiple medical problems;Vascular compromise Wound Plan: Pt to recieve manual treatment to decrease edema followed by profore wrap with alginate; assess if two sheets of alginate per leg is enough to keep LE from becoming macerated.      Problem List Patient Active Problem List   Diagnosis Date Noted  . Bilateral hip joint arthritis 09/06/2013  . Degeneration of intervertebral disc of lumbar region 09/06/2013  . BACK PAIN 01/24/2009  . CLOSED FRACTURE OF METATARSAL BONE 04/20/2007    GP    Ramey Ketcherside R 10/20/2013, 6:55 PM

## 2013-10-20 NOTE — Telephone Encounter (Signed)
Patient aware.

## 2013-10-25 ENCOUNTER — Ambulatory Visit (HOSPITAL_COMMUNITY)
Admission: RE | Admit: 2013-10-25 | Discharge: 2013-10-25 | Disposition: A | Discharge status: Home / Self Care | Payer: Medicare Other | Source: Ambulatory Visit | Attending: Family Medicine | Admitting: Family Medicine

## 2013-10-25 ENCOUNTER — Telehealth (HOSPITAL_COMMUNITY): Payer: Self-pay

## 2013-10-25 ENCOUNTER — Telehealth: Payer: Self-pay | Admitting: *Deleted

## 2013-10-25 DIAGNOSIS — IMO0001 Reserved for inherently not codable concepts without codable children: Secondary | ICD-10-CM | POA: Diagnosis not present

## 2013-10-25 NOTE — Progress Notes (Signed)
Physical Therapy - Wound Therapy  Treatment   Patient Details  Name: [name] MRN: [id number] Date of Birth: October 02, 1931  Today's Date: 10/25/2013 Time: 0102-7253 Time Calculation (min): 85 min Charge:  debridment  Visit#: 3 of 10  Re-eval: 11/17/13  Subjective Subjective Assessment Subjective: Pt states that her treatment was very painful last week.  Pt states that her legs have been very painful since last session  Patient and Family Stated Goals: LE to not be as swollen nor for there to be any draining from them  Date of Onset: 09/16/13  Pain Assessment Pain Assessment Pain Score: 10-Worst pain ever  Wound Therapy Wound / Incision (Open or Dehisced) 10/18/13 Other (Comment) Leg Left both LE have multiple small open areas that are weeping fluid  (Active)  Dressing Type Compression wrap;Silver dressings 10/25/2013  4:36 PM  Dressing Changed Changed 10/25/2013  4:36 PM  Dressing Status Old drainage 10/25/2013  4:36 PM  Dressing Change Frequency Every 3 days 10/25/2013  4:36 PM  Site / Wound Assessment Yellow;Red 10/25/2013  4:36 PM  % Wound base Red or Granulating 70% 10/25/2013  4:36 PM  % Wound base Yellow 30% 10/25/2013  4:36 PM  Peri-wound Assessment Purple;Hemosiderin;Erythema (blanchable);Edema;Induration 10/25/2013  4:36 PM  Wound Length (cm) 3 cm 10/25/2013  4:36 PM  Wound Width (cm) 4 cm 10/25/2013  4:36 PM  Wound Depth (cm) 0.1 cm 10/25/2013  4:36 PM  Margins Unattached edges (unapproximated) 10/25/2013  4:36 PM  Closure None 10/25/2013  4:36 PM  Drainage Amount Moderate 10/25/2013  4:36 PM  Drainage Description Green 10/25/2013  4:36 PM  Non-staged Wound Description Partial thickness 10/25/2013  4:36 PM  Treatment Cleansed;Debridement (Selective);Other (Comment) 10/25/2013  4:36 PM     Wound / Incision (Open or Dehisced) 10/19/13 Leg Right;Anterior (Active)  Dressing Type Compression wrap;Silver dressings 10/25/2013  4:36 PM  Dressing Changed Changed 10/25/2013  4:36 PM   Dressing Status Old drainage 10/25/2013  4:36 PM  Dressing Change Frequency Every 3 days 10/25/2013  4:36 PM  Site / Wound Assessment Red;Yellow 10/25/2013  4:36 PM  % Wound base Red or Granulating 80% 10/25/2013  4:36 PM  % Wound base Yellow 20% 10/25/2013  4:36 PM  % Wound base Black 0% 10/25/2013  4:36 PM  Peri-wound Assessment Edema;Hemosiderin;Induration;Purple 10/25/2013  4:36 PM  Wound Length (cm) 1.5 cm 10/25/2013  4:36 PM  Wound Width (cm) 1.5 cm 10/25/2013  4:36 PM  Wound Depth (cm) 0.1 cm 10/25/2013  4:36 PM  Closure None 10/25/2013  4:36 PM  Drainage Amount Moderate 10/25/2013  4:36 PM  Drainage Description Green 10/25/2013  4:36 PM  Treatment Cleansed;Debridement (Selective);Other (Comment) 10/25/2013  4:36 PM       Physical Therapy Assessment and Plan Wound Therapy - Assess/Plan/Recommendations Wound Therapy - Clinical Statement: Pt to department stating that she had to come in due to her LE hurting her ever since she left treatment last week.  Removal of profore demonstrates segmental swelling with B LE having multiple skin folds from dressing as well as green drainage. Silver was added to dressing to decrease bacteria.   Areas of biggest compression 2 inches superior to ankle B and  approximately 1/4 down pt leg were compressed enough to cause deep bruising ( circumferentially there is a purple ring.).  Therapist spent a significant amount of time on each LE with manusl decongestive techniques to aquire a normalized cone shape LE.  Manual techniques were successful but pt still has deep purple marks B  in the forementioned areas.  Pt told to contact clinic immediately if LE have increased pain as this should not occur.   Factors Delaying/Impairing Wound Healing: Multiple medical problems;Vascular compromise Hydrotherapy Plan: Dressing change;Patient/family education Wound Therapy - Frequency:  (2x week as drainage has decreased) Wound Therapy - Current Recommendations: PT Wound Plan: Pt to  recieve manual treatment to decrease edema followed by profore wrap.  Assess tissue damage and amount and color of drainage of LE next treatment.       Goals    Problem List Patient Active Problem List   Diagnosis Date Noted  . Bilateral hip joint arthritis 09/06/2013  . Degeneration of intervertebral disc of lumbar region 09/06/2013  . BACK PAIN 01/24/2009  . CLOSED FRACTURE OF METATARSAL BONE 04/20/2007    GP    Spenser Cong,CINDY 10/25/2013, 4:51 PM

## 2013-10-25 NOTE — Telephone Encounter (Signed)
Patient called requesting hip injections Advised no per Dr Aline Brochure Patient aware

## 2013-10-27 ENCOUNTER — Ambulatory Visit (HOSPITAL_COMMUNITY): Payer: Medicare Other

## 2013-10-29 ENCOUNTER — Ambulatory Visit (HOSPITAL_COMMUNITY)
Admission: RE | Admit: 2013-10-29 | Discharge: 2013-10-29 | Disposition: A | Discharge status: Home / Self Care | Payer: Medicare Other | Source: Ambulatory Visit | Attending: Family Medicine | Admitting: Family Medicine

## 2013-10-29 DIAGNOSIS — IMO0001 Reserved for inherently not codable concepts without codable children: Secondary | ICD-10-CM | POA: Diagnosis not present

## 2013-10-29 NOTE — Progress Notes (Signed)
Physical Therapy - Wound Therapy  Treatment   Patient Details  Name: [name] MRN: [id number] Date of Birth: 1932/02/01  Today's Date: 10/29/2013 Time: 1650-1750 Time Calculation (min): 60 min Charge: selective debridement <20 cm  Visit#: 3 of 10  Re-eval: 11/17/13  Subjective Subjective Assessment Subjective: No pain wounds, hips are bothering her with increased walking  Pain Assessment Pain Assessment Pain Assessment: No/denies pain  Wound Therapy  10/29/13 1842  Subjective Assessment  Subjective No pain wounds, hips are bothering her with increased walking  Wound / Incision (Open or Dehisced) 10/18/13 Other (Comment) Leg Left both LE have multiple small open areas that are weeping fluid   Date First Assessed: 10/19/13   Location: Leg  Location Orientation: Right;Anterior  Present on Admission: No  Dressing Type Compression wrap;Silver dressings  Dressing Changed Changed  Dressing Status New drainage  Site / Wound Assessment Red;Yellow  % Wound base Red or Granulating 85%  % Wound base Yellow 15%  % Wound base Black 0%  Peri-wound Assessment Edema;Hemosiderin;Induration;Purple  Closure None  Drainage Amount Moderate  Drainage Description Green  Treatment Cleansed;Debridement (Selective) (Manual for edema control )  Wound / Incision (Open or Dehisced)  Date First Assessed/Time First Assessed: 10/18/13 1145   Wound Type: (c) Other (Comment)  Location: Leg  Location Orientation: Left  Wound Description (Comments): both LE have multiple small open areas that are weeping fluid   Dressing Type Compression wrap;Silver dressings  Dressing Changed Changed  Dressing Status New drainage  Site / Wound Assessment Yellow;Red  % Wound base Red or Granulating 80%  % Wound base Yellow 20%  Peri-wound Assessment Purple;Hemosiderin;Erythema (blanchable);Edema;Induration  Margins Unattached edges (unapproximated)  Closure None  Drainage Amount Moderate  Drainage Description  Green  Non-staged Wound Description Partial thickness  Treatment Cleansed;Debridement (Selective) (Manual for edema control)  Wound Therapy - Assess/Plan/Recommendations  Wound Therapy - Clinical Statement Pt tolerated well towards selective debridement, no reports of pain through session.  Drainage green with edema present.  Selective debridemnt for remoal of slough and dry skin to promote healing.  Manual techniques complete  to reduce edema.  Continued with profore lite (no Profore in stock) for edema control.    Factors Delaying/Impairing Wound Healing Multiple medical problems;Vascular compromise  Hydrotherapy Plan Dressing change;Patient/family education  Wound Therapy - Current Recommendations PT  Wound Plan Pt to recieve manual treatment to decrease edema followed by profore wrap.  Assess tissue damage and amount of drainage of LE next treatment.     Physical Therapy Assessment and Plan Wound Therapy - Assess/Plan/Recommendations Wound Therapy - Clinical Statement: Pt tolerated well towards selective debridement, no reports of pain through session.  Drainage green with edema present.  Selective debridemnt for remoal of slough and dry skin to promote healing.  Manual techniques complete  to reduce edema.  Continued with profore lite (no Profore in stock) for edema control.   Factors Delaying/Impairing Wound Healing: Multiple medical problems;Vascular compromise Hydrotherapy Plan: Dressing change;Patient/family education Wound Therapy - Current Recommendations: PT Wound Plan: Pt to recieve manual treatment to decrease edema followed by profore wrap.  Assess tissue damage and amount of drainage of LE next treatment.       Goals    Problem List Patient Active Problem List   Diagnosis Date Noted  . Bilateral hip joint arthritis 09/06/2013  . Degeneration of intervertebral disc of lumbar region 09/06/2013  . BACK PAIN 01/24/2009  . CLOSED FRACTURE OF METATARSAL BONE 04/20/2007     GP  Aldona Lento 10/29/2013, 6:52 PM

## 2013-11-01 ENCOUNTER — Ambulatory Visit (HOSPITAL_COMMUNITY)
Admission: RE | Admit: 2013-11-01 | Discharge: 2013-11-01 | Disposition: A | Discharge status: Home / Self Care | Payer: Medicare Other | Source: Ambulatory Visit | Attending: Family Medicine | Admitting: Family Medicine

## 2013-11-01 DIAGNOSIS — IMO0001 Reserved for inherently not codable concepts without codable children: Secondary | ICD-10-CM | POA: Diagnosis not present

## 2013-11-01 NOTE — Progress Notes (Signed)
Physical Therapy - Wound Therapy  Treatment   Patient Details  Name: [name] MRN: [id number] Date of Birth: [birth year]  Today's Date: 11/01/2013 Time: 7062-3762 Time Calculation (min): 60 min Visit#: 5 of 10  Re-eval: 11/17/13 Charges:  Deb<20cm, profore X2  Subjective Subjective Assessment Subjective: No pain wounds, hips are bothering her with increased walking   Wound Therapy Wound / Incision (Open or Dehisced) 10/18/13 Other (Comment) Leg Left both LE have multiple small open areas that are weeping fluid  (Active)  Dressing Type profore lite;Impregnated gauze (bismuth);Silver hydrofiber 11/01/2013  6:00 PM  Dressing Changed Changed 11/01/2013  6:00 PM  Dressing Status New drainage 11/01/2013  6:00 PM  Dressing Change Frequency Every 3 days 10/25/2013  4:36 PM  Site / Wound Assessment Yellow;Red 11/01/2013  6:00 PM  % Wound base Red or Granulating 80% 11/01/2013  6:00 PM  % Wound base Yellow 20% 11/01/2013  6:00 PM  Peri-wound Assessment Purple;Hemosiderin;Erythema (blanchable);Edema;Induration 11/01/2013  6:00 PM  Wound Length (cm) 3 cm 10/25/2013  4:36 PM  Wound Width (cm) 4 cm 10/25/2013  4:36 PM  Wound Depth (cm) 0.1 cm 10/25/2013  4:36 PM  Margins Unattached edges (unapproximated) 11/01/2013  6:00 PM  Closure None 11/01/2013  6:00 PM  Drainage Amount Moderate 11/01/2013  6:00 PM  Drainage Description Yellow, serous 11/01/2013  6:00 PM  Non-staged Wound Description Partial thickness 11/01/2013  6:00 PM  Treatment Cleansed;Debridement (Selective) 11/01/2013  6:00 PM     Wound / Incision (Open or Dehisced) 10/19/13 Leg Right;Anterior (Active)  Dressing Type Impregnated gauze (bismuth);Silver hydrofiber;profore lite 11/01/2013  6:00 PM  Dressing Changed Changed 11/01/2013  6:00 PM  Dressing Status New drainage 11/01/2013  6:00 PM  Dressing Change Frequency Every 3 days 10/25/2013  4:36 PM  Site / Wound Assessment Red;Yellow 11/01/2013  6:00 PM  % Wound base Red or Granulating 85%  11/01/2013  6:00 PM  % Wound base Yellow 15% 11/01/2013  6:00 PM  % Wound base Black 0% 11/01/2013  6:00 PM  Peri-wound Assessment Edema;Hemosiderin;Induration;Purple 11/01/2013  6:00 PM  Wound Length (cm) 1.5 cm 10/25/2013  4:36 PM  Wound Width (cm) 1.5 cm 10/25/2013  4:36 PM  Wound Depth (cm) 0.1 cm 10/25/2013  4:36 PM  Closure None 11/01/2013  6:00 PM  Drainage Amount Moderate 11/01/2013  6:00 PM  Drainage Description Yellow, serous 11/01/2013  6:00 PM  Treatment Cleansed;Debridement (Selective) 11/01/2013  6:00 PM       Physical Therapy Assessment and Plan Wound Therapy - Assess/Plan/Recommendations Wound Therapy - Clinical Statement: good tolerance to selective debridement.  Noted maceration from increased drainage.  Serous drainage present (no longer green).  Changed dressing to silver hydrofiber to decrease maceration perimeter of wound. Continued with profore lite (no Profore in stock) for edema control.  Bilateral LE's cleansed and moisturized prior to dressings.   Wound Plan: Pt to receive manual treatment to decrease edema followed by profore wrap.  May need more manual to help decrease edema.      Problem List Patient Active Problem List   Diagnosis Date Noted  . Bilateral hip joint arthritis 09/06/2013  . Degeneration of intervertebral disc of lumbar region 09/06/2013  . BACK PAIN 01/24/2009  . CLOSED FRACTURE OF METATARSAL BONE 04/20/2007     Teena Irani, PTA/CLT 11/01/2013, 6:49 PM

## 2013-11-02 ENCOUNTER — Ambulatory Visit (HOSPITAL_COMMUNITY): Payer: Medicare Other | Admitting: Physical Therapy

## 2013-11-04 ENCOUNTER — Ambulatory Visit (HOSPITAL_COMMUNITY)
Admission: RE | Admit: 2013-11-04 | Discharge: 2013-11-04 | Disposition: A | Discharge status: Home / Self Care | Payer: Medicare Other | Source: Ambulatory Visit | Attending: Family Medicine | Admitting: Family Medicine

## 2013-11-04 DIAGNOSIS — M7989 Other specified soft tissue disorders: Secondary | ICD-10-CM | POA: Diagnosis not present

## 2013-11-04 DIAGNOSIS — S81009A Unspecified open wound, unspecified knee, initial encounter: Secondary | ICD-10-CM | POA: Diagnosis not present

## 2013-11-04 DIAGNOSIS — S91009A Unspecified open wound, unspecified ankle, initial encounter: Secondary | ICD-10-CM

## 2013-11-04 DIAGNOSIS — IMO0001 Reserved for inherently not codable concepts without codable children: Secondary | ICD-10-CM | POA: Insufficient documentation

## 2013-11-04 DIAGNOSIS — S81809A Unspecified open wound, unspecified lower leg, initial encounter: Secondary | ICD-10-CM

## 2013-11-04 NOTE — Progress Notes (Signed)
Physical Therapy - Wound Therapy  Treatment   Patient Details  Name: [name] MRN: [id number] Date of Birth: [birth year]  Today's Date: 11/04/2013 Time: 1505-1600 Time Calculation (min): 55 min Charges: deb<20cm  Subjective Subjective Assessment Subjective: No pain wounds, hips are bothering her with increased walking.  Pt had to use wheelchair to get to wound room today.   Wound Therapy Wound / Incision (Open or Dehisced) 10/18/13 Other (Comment) Leg Left both LE have multiple small open areas that are weeping fluid  (Active)  Dressing Type  Coban 2 11/04/2013  5:00 PM  Dressing Changed Changed 11/04/2013  5:00 PM  Dressing Status New drainage 11/04/2013  5:00 PM  Dressing Change Frequency Every 3 days 10/25/2013  4:36 PM  Site / Wound Assessment Yellow;Red 11/04/2013  5:00 PM  % Wound base Red or Granulating 80% 11/04/2013  5:00 PM  % Wound base Yellow 20% 11/04/2013  5:00 PM  Peri-wound Assessment Hemosiderin;Erythema (blanchable);Edema;Induration;Pink 11/04/2013  5:00 PM  Wound Length (cm) 3 cm 10/25/2013  4:36 PM  Wound Width (cm) 4 cm 10/25/2013  4:36 PM  Wound Depth (cm) 0.1 cm 10/25/2013  4:36 PM  Margins Unattached edges (unapproximated) 11/04/2013  5:00 PM  Closure None 11/04/2013  5:00 PM  Drainage Amount Minimal 11/04/2013  5:00 PM  Drainage Description Serous 11/04/2013  5:00 PM  Non-staged Wound Description Not applicable 04/12/5282  1:32 PM  Treatment Cleansed;Debridement (Selective) 11/04/2013  5:00 PM     Wound / Incision (Open or Dehisced) 10/19/13 Leg Right;Anterior (Active)  Dressing Type Coban 2 11/04/2013  5:00 PM  Dressing Changed Changed 11/04/2013  5:00 PM  Dressing Status New drainage 11/04/2013  5:00 PM  Dressing Change Frequency Every 3 days 10/25/2013  4:36 PM  Site / Wound Assessment Red;Yellow 11/04/2013  5:00 PM  % Wound base Red or Granulating 95% 11/04/2013  5:00 PM  % Wound base Yellow 5% 11/04/2013  5:00 PM  % Wound base Black 0% 11/04/2013  5:00 PM  Peri-wound Assessment  Edema;Hemosiderin;Induration;Pink 11/04/2013  5:00 PM  Wound Length (cm) 1.5 cm 10/25/2013  4:36 PM  Wound Width (cm) 1.5 cm 10/25/2013  4:36 PM  Wound Depth (cm) 0.1 cm 10/25/2013  4:36 PM  Closure None 11/01/2013  6:00 PM  Drainage Amount Scant 11/04/2013  5:00 PM  Drainage Description Serous 11/04/2013  5:00 PM  Non-staged Wound Description Not applicable 06/05/100  7:25 PM  Treatment Cleansed;Debridement (Selective) 11/04/2013  5:00 PM      Date 11/04/2013    right left  MTP 24.50 24.00  Ankle/submalleolar 32 34.4  largest calf 49.00 47.00  sub patellar 49.80 50.00  mid patellar 52.80 55.00      Sum of squares 9293.13 9493.36  Total Volume 2958.096 3021.831    Physical Therapy Assessment and Plan Wound Therapy - Assess/Plan/Recommendations Wound Therapy - Clinical Statement: Bilateral LE's overall improved with decreased edema, induration and erythema perimeter of wound.  Reduction in drainage to min on Lt LE, scant on Rt LE.  Silver hydrofiber adherent to wound on Lt LE and xeroform increasing moisture of surrounding skin.  Used profore non adherent dressing and coban 2 system (out of profore bandages).  Bilateral LE's measured circumferentially to  assess swelling.  Decreased redness to length of 4 inches.  LE's cleansed and moisturized prior to bandaging.  PT reported comfort at end of session. Wound Therapy - Current Recommendations: PT Wound Plan: continue woundcare for bilateral LE's with compression drainage.  Encourage continuation of compression garments  when wounds are healed.       Problem List Patient Active Problem List   Diagnosis Date Noted  . Bilateral hip joint arthritis 09/06/2013  . Degeneration of intervertebral disc of lumbar region 09/06/2013  . BACK PAIN 01/24/2009  . CLOSED FRACTURE OF METATARSAL BONE 04/20/2007       Teena Irani, PTA/CLT 11/04/2013, 5:56 PM

## 2013-11-11 ENCOUNTER — Ambulatory Visit (HOSPITAL_COMMUNITY)
Admission: RE | Admit: 2013-11-11 | Discharge: 2013-11-11 | Disposition: A | Discharge status: Home / Self Care | Payer: Medicare Other | Source: Ambulatory Visit | Attending: Family Medicine | Admitting: Family Medicine

## 2013-11-11 DIAGNOSIS — IMO0001 Reserved for inherently not codable concepts without codable children: Secondary | ICD-10-CM | POA: Diagnosis not present

## 2013-11-11 NOTE — Progress Notes (Signed)
Physical Therapy - Wound Therapy  Treatment   Patient Details  Name: [name] MRN: [id number] Date of Birth: [birth year]  Today's Date: 11/11/2013 Time: 0938-1829 Time Calculation (min): 75 min Visit#: 6 of 10  Re-eval: 11/17/13 Charges:  Deb<20cm  Subjective Subjective Assessment Subjective: Pt states her legs feel better today than they have yet.  States no discomfort, itching or drainage.   Wound Therapy Wound / Incision (Open or Dehisced) 10/18/13 Other (Comment) Leg Left both LE have multiple small open areas that are weeping fluid  (Active)  Dressing Type Coban 2 ;xeroform 11/11/2013  4:00 PM  Dressing Changed Changed 11/11/2013  4:00 PM  Dressing Status Intact 11/11/2013  4:00 PM  Dressing Change Frequency Every 3 days 10/25/2013  4:36 PM  Site / Wound Assessment Yellow;Red 11/11/2013  4:00 PM  % Wound base Red or Granulating 90% 11/11/2013  4:00 PM  % Wound base Yellow 10% 11/11/2013  4:00 PM  Peri-wound Assessment Hemosiderin;Erythema (blanchable);Edema;Induration;Pink 11/11/2013  4:00 PM  Wound Length (cm) 3 cm 10/25/2013  4:36 PM  Wound Width (cm) 4 cm 10/25/2013  4:36 PM  Wound Depth (cm) 0.1 cm 10/25/2013  4:36 PM  Margins Unattached edges (unapproximated) 11/11/2013  4:00 PM  Closure None 11/11/2013  4:00 PM  Drainage Amount Scant 11/11/2013  4:00 PM  Drainage Description Serous 11/11/2013  4:00 PM  Non-staged Wound Description Not applicable 9/37/1696  7:89 PM  Treatment Cleansed;Debridement (Selective) 11/11/2013  4:00 PM     Wound / Incision (Open or Dehisced) 10/19/13 Leg Right;Anterior (Active)  Dressing Type Coban 2, xeroform 11/11/2013  4:00 PM  Dressing Changed Changed 11/11/2013  4:00 PM  Dressing Status Intact 11/11/2013  4:00 PM  Dressing Change Frequency Every 3 days 10/25/2013  4:36 PM  Site / Wound Assessment Red;Yellow 11/11/2013  4:00 PM  % Wound base Red or Granulating 95% 11/11/2013  4:00 PM  % Wound base Yellow 5% 11/11/2013  4:00 PM  % Wound base Black  0% 11/11/2013  4:00 PM  Peri-wound Assessment Edema;Hemosiderin;Induration;Pink 11/11/2013  4:00 PM  Wound Length (cm) 1.5 cm 10/25/2013  4:36 PM  Wound Width (cm) 1.5 cm 10/25/2013  4:36 PM  Wound Depth (cm) 0.1 cm 10/25/2013  4:36 PM  Closure None 11/01/2013  6:00 PM  Drainage Amount Scant 11/11/2013  4:00 PM  Drainage Description Serous 11/11/2013  4:00 PM  Non-staged Wound Description Not applicable 3/81/0175  1:02 PM  Treatment Cleansed;Debridement (Selective) 11/11/2013  4:00 PM       Physical Therapy Assessment and Plan Wound Therapy - Assess/Plan/Recommendations Wound Therapy - Clinical Statement: Bilateral LE's with vast improvement today.  Scant draingage on Lt LE with 95% granulation bilaterally.  Continued erythema perimeter of wounds, however improved from previous session.  Washed bilateral LE's.  Continued with xeroform dressing to maintain moisture of wound bed, perimeter moisturized with lotion prior to bandaging.  Continued with coban 2 system as good results.  PT reported comfort at end of session. Wound Plan: continue woundcare for bilateral LE's with compression drainage.  Encourage continuation of compression garments when wounds are healed. Pt instructed to bring compression garments next visit.       Problem List Patient Active Problem List   Diagnosis Date Noted  . Bilateral hip joint arthritis 09/06/2013  . Degeneration of intervertebral disc of lumbar region 09/06/2013  . BACK PAIN 01/24/2009  . CLOSED FRACTURE OF METATARSAL BONE 04/20/2007     Teena Irani, PTA/CLT 11/11/2013, 4:55 PM

## 2013-11-15 ENCOUNTER — Ambulatory Visit (HOSPITAL_COMMUNITY): Payer: Medicare Other | Admitting: Physical Therapy

## 2013-11-16 DIAGNOSIS — Z6841 Body Mass Index (BMI) 40.0 and over, adult: Secondary | ICD-10-CM | POA: Diagnosis not present

## 2013-11-16 DIAGNOSIS — E785 Hyperlipidemia, unspecified: Secondary | ICD-10-CM | POA: Diagnosis not present

## 2013-11-16 DIAGNOSIS — J069 Acute upper respiratory infection, unspecified: Secondary | ICD-10-CM | POA: Diagnosis not present

## 2013-11-16 DIAGNOSIS — Z0000 Encounter for general adult medical examination without abnormal findings: Secondary | ICD-10-CM | POA: Diagnosis not present

## 2013-11-19 ENCOUNTER — Ambulatory Visit (HOSPITAL_COMMUNITY)
Admission: RE | Admit: 2013-11-19 | Discharge: 2013-11-19 | Disposition: A | Discharge status: Home / Self Care | Payer: Medicare Other | Source: Ambulatory Visit | Attending: Family Medicine | Admitting: Family Medicine

## 2013-11-19 DIAGNOSIS — IMO0001 Reserved for inherently not codable concepts without codable children: Secondary | ICD-10-CM | POA: Diagnosis not present

## 2013-11-19 NOTE — Progress Notes (Signed)
Physical Therapy - Wound Therapy  Treatment   Patient Details  Name: [name] MRN: [id number] Date of Birth: Nov 27, 1931  Today's Date: 11/19/2013 Time: 4401-0272 Time Calculation (min): 58 min  Visit#: 7 of 15  Re-eval: 12/17/13  Subjective Subjective Assessment Subjective: No wound pain, Rt hip and lower back are bothering her with walking and sitting for long periods of time.  Pt reported she intended to bring compressoin hose with her to therapy, forget it at home.  Pain Assessment Pain Assessment Pain Score: 7  Pain Location: Back Pain Orientation: Lower;Right  Wound Therapy  11/19/13 1636  Subjective Assessment  Subjective No wound pain, Rt hip and lower back are bothering her with walking and sitting for long periods of time.    Wound / Incision (Open or Dehisced)  Date First Assessed: 10/19/13   Location: Leg  Location Orientation: Right;Anterior  Present on Admission: No  Dressing Type Compression wrap;Impregnated gauze (bismuth);Impregnated gauze (petrolatum) (Profore)  Dressing Changed Changed  Dressing Status Intact  Site / Wound Assessment Red;Yellow  % Wound base Red or Granulating 95%  % Wound base Yellow 5%  % Wound base Black 0%  Peri-wound Assessment Edema;Hemosiderin;Induration;Pink  Wound Length (cm) 0.5 cm  Wound Width (cm) 0.2 cm  Wound Depth (cm) 0 cm  Drainage Amount Scant  Drainage Description Serous  Non-staged Wound Description Not applicable  Treatment Cleansed;Debridement (Selective)  Wound / Incision (Open or Dehisced)  Date First Assessed/Time First Assessed: 10/18/13 1145   Wound Type: (c) Other (Comment)  Location: Leg  Location Orientation: Left  Wound Description (Comments): both LE have multiple small open areas that are weeping fluid   Dressing Type Impregnated gauze (bismuth);Compression wrap (Vaseline on LE, Profore )  Dressing Changed Changed  Dressing Status Intact  Site / Wound Assessment Red;Granulation tissue  %  Wound base Red or Granulating 100%  % Wound base Yellow 0%  Peri-wound Assessment Hemosiderin;Erythema (blanchable);Edema;Induration;Pink  Margins Unattached edges (unapproximated)  Closure None  Drainage Amount None  Non-staged Wound Description Not applicable  Treatment Cleansed  Wound Therapy - Assess/Plan/Recommendations  Wound Therapy - Clinical Statement No wounds remain on Lt LE, Rt wound on anterior leg size decreased in width, length and depth.  Continued with compression dressings Bil LE for edema control (profore per PT and pt c/o heat from coban 2 dressings).  Following discussion with evaluation PT, pt will benefit with edema control with decogestive massage Bil LE for edema control.    Factors Delaying/Impairing Wound Healing Multiple medical problems;Vascular compromise  Hydrotherapy Plan Dressing change;Patient/family education  Wound Therapy - Current Recommendations PT  Wound Plan Continue with skilled intervention for wound care to Rt LE, check on Lt LE.  Recommend decongestive massage for edema control per PT.     Circumference measurement   Rt    Lt MTP  24.5 cm(was 25.2)  24 cm was 24.5) Ankle  34 cm (was 33.5)  36 cm (was 37) Calf  50.9 cm (was 50.5)  48 (was 51.5)  Physical Therapy Assessment and Plan Wound Therapy - Assess/Plan/Recommendations Wound Therapy - Clinical Statement: No wounds remain on Lt LE, Rt wound on anterior leg size decreased in width, length and depth.  Continued with compression dressings Bil LE for edema control (profore per PT and pt c/o heat from coban 2 dressings).  Following discussion with evaluation PT, pt will benefit with edema control with decogestive massage Bil LE for edema control.   Factors Delaying/Impairing Wound Healing: Multiple medical problems;Vascular  compromise Hydrotherapy Plan: Dressing change;Patient/family education Wound Therapy - Current Recommendations: PT Wound Plan: Continue with skilled intervention for wound  care to Rt LE, check on Lt LE.  Recommend decongestive massage for edema control per PT.        Goals Wound Therapy Goals - Improve the function of patient's integumentary system by progressing the wound(s) through the phases of wound healing by: Patient/Family will be able to : verbalize the importance of using compression stockings  Patient/Family Instruction Goal - Progress: Progressing toward goal Additional Wound Therapy Goal: Pt Swelling to decrease by 1.5 cm at MTP; 3 cm at ankle and 2cm at lg part of calf to make walking easier.  Additional Wound Therapy Goal - Progress: Progressing toward goal  Problem List Patient Active Problem List   Diagnosis Date Noted  . Bilateral hip joint arthritis 09/06/2013  . Degeneration of intervertebral disc of lumbar region 09/06/2013  . BACK PAIN 01/24/2009  . CLOSED FRACTURE OF METATARSAL BONE 04/20/2007    GP Functional Assessment Tool Used: clinical judgement  Functional Limitation: Other PT primary Other PT Primary Current Status (E6950): At least 40 percent but less than 60 percent impaired, limited or restricted Other PT Primary Goal Status (H2257): At least 1 percent but less than 20 percent impaired, limited or restricted  Aldona Lento 11/19/2013, 4:59 PM

## 2013-11-22 ENCOUNTER — Ambulatory Visit (HOSPITAL_COMMUNITY): Payer: Medicare Other | Admitting: Physical Therapy

## 2013-11-24 ENCOUNTER — Telehealth (HOSPITAL_COMMUNITY): Payer: Self-pay

## 2013-11-24 ENCOUNTER — Ambulatory Visit (HOSPITAL_COMMUNITY)
Admission: RE | Admit: 2013-11-24 | Discharge: 2013-11-24 | Disposition: A | Discharge status: Home / Self Care | Payer: Medicare Other | Source: Ambulatory Visit | Attending: Family Medicine | Admitting: Family Medicine

## 2013-11-24 DIAGNOSIS — IMO0001 Reserved for inherently not codable concepts without codable children: Secondary | ICD-10-CM | POA: Diagnosis not present

## 2013-11-24 NOTE — Progress Notes (Signed)
Physical Therapy - Wound Therapy  Treatment   Patient Details  Name: [name] MRN: [id number] Date of Birth: May 06, 1931  Today's Date: 11/24/2013 Time: 2505-3976 Time Calculation (min): 55 min  Visit#: 8 of 15  Re-eval: 12/17/13  Subjective Subjective Assessment Subjective: Pt continues to be limited by back and Rt hip pain.  No reoprts of wound pain today.  Pain Assessment Pain Assessment Pain Score: 6  Pain Location: Back Pain Orientation: Lower;Right  Wound Therapy  11/24/13 1537  Subjective Assessment  Subjective Pt continues to be limited by back and Rt hip pain.  No reoprts of wound pain today.  Wound / Incision (Open or Dehisced)  Date First Assessed: 10/19/13   Location: Leg  Location Orientation: Right;Anterior  Present on Admission: No  Dressing Type Impregnated gauze (petrolatum);Other (Comment) (Profore with vaseline )  Dressing Changed Changed  Dressing Status Clean;Dry;Intact  Site / Wound Assessment Red;Yellow  % Wound base Red or Granulating 95%  % Wound base Yellow 5%  % Wound base Black 0%  Drainage Amount Scant  Drainage Description Serous  Non-staged Wound Description Not applicable  Treatment Cleansed;Debridement (Selective)  Wound / Incision (Open or Dehisced)  Date First Assessed/Time First Assessed: 10/18/13 1145   Wound Type: (c) Other (Comment)  Location: Leg  Location Orientation: Left  Wound Description (Comments): both LE have multiple small open areas that are weeping fluid   Dressing Type Other (Comment);ABD (Vaseline whole leg, ABD over opening and Profore)  Dressing Changed Changed  Dressing Status Clean;Dry;Intact  Site / Wound Assessment Red;Granulation tissue  % Wound base Red or Granulating 100%  % Wound base Yellow 0%  Peri-wound Assessment Hemosiderin;Erythema (blanchable);Edema;Induration;Pink  Margins Unattached edges (unapproximated)  Closure None  Drainage Amount Scant  Drainage Description Serous  Non-staged  Wound Description Not applicable  Treatment Cleansed  Selective Debridement  Selective Debridement - Location Anterior Bil LE  Selective Debridement - Tools Used Forceps  Selective Debridement - Tissue Removed Dry skin wound perimeter  Wound Therapy - Assess/Plan/Recommendations  Wound Therapy - Clinical Statement Lt LE with new openings proximal knee, no debridement necessary to new opening, just debridement for removal of dead skin perimeter of wound.  Cleansed LE, applied vaseline whlole leg and ABD pad over new ovpening.  Continued with Profore for compressoin dressings Bil LE.  No reports of pain thorugh session.  Factors Delaying/Impairing Wound Healing Multiple medical problems;Vascular compromise  Hydrotherapy Plan Dressing change;Patient/family education  Wound Therapy - Current Recommendations PT  Wound Plan Continue with skilled intervention for wound care to Rt LE, check on Lt LE.  Recommend decongestive massage for edema control per PT.      Selective Debridement Selective Debridement - Location: Anterior Bil LE Selective Debridement - Tools Used: Forceps Selective Debridement - Tissue Removed: Dry skin wound perimeter   Physical Therapy Assessment and Plan Wound Therapy - Assess/Plan/Recommendations Wound Therapy - Clinical Statement: Lt LE with new openings proximal knee, no debridement necessary to new opening, just debridement for removal of dead skin perimeter of wound.  Cleansed LE, applied vaseline whlole leg and ABD pad over new ovpening.  Continued with Profore for compressoin dressings Bil LE.  No reports of pain thorugh session. Factors Delaying/Impairing Wound Healing: Multiple medical problems;Vascular compromise Hydrotherapy Plan: Dressing change;Patient/family education Wound Therapy - Current Recommendations: PT Wound Plan: Continue with skilled intervention for wound care to Rt LE, check on Lt LE.  Recommend decongestive massage for edema control per PT.  Goals    Problem List Patient Active Problem List   Diagnosis Date Noted  . Bilateral hip joint arthritis 09/06/2013  . Degeneration of intervertebral disc of lumbar region 09/06/2013  . BACK PAIN 01/24/2009  . CLOSED FRACTURE OF METATARSAL BONE 04/20/2007    GP    Aldona Lento 11/24/2013, 3:47 PM

## 2013-11-24 NOTE — Telephone Encounter (Signed)
her legs are pouring water in her shoes. Advised patient to go to ED if they continued to bother her. I told Ms. [place], she would be called back tomorrow after ITalk to PT to Schedule her to be seen.

## 2013-11-25 ENCOUNTER — Ambulatory Visit (HOSPITAL_COMMUNITY)
Admission: RE | Admit: 2013-11-25 | Discharge: 2013-11-25 | Disposition: A | Discharge status: Home / Self Care | Payer: Medicare Other | Source: Ambulatory Visit | Attending: Family Medicine | Admitting: Family Medicine

## 2013-11-25 DIAGNOSIS — IMO0001 Reserved for inherently not codable concepts without codable children: Secondary | ICD-10-CM | POA: Diagnosis not present

## 2013-11-25 NOTE — Progress Notes (Signed)
Physical Therapy - Wound Therapy  Treatment   Patient Details  Name: [name] MRN: [id number] Date of Birth: [birth year]  Today's Date: 11/25/2013 Time: 1600-1710 Time Calculation (min): 70 min Charge:  Debridement 7062-3762; manual [id number] Visit#: 9 of 15  Re-eval: 12/17/13  Subjective Subjective Assessment Subjective: Pt states Lt LE has been weeping since she left her and the bandage is wet.  Pain Assessment Pain Assessment Pain Score: 5  Pain Location: Back Pain Orientation: Lower  Wound Therapy Wound / Incision (Open or Dehisced) 10/18/13 Other (Comment) Leg Left both LE have multiple small open areas that are weeping fluid  (Active)  Dressing Type Alginate;ABD 11/25/2013  5:29 PM  Dressing Changed Changed 11/25/2013  5:29 PM  Dressing Status Clean;Dry;Intact 11/24/2013  3:37 PM  Dressing Change Frequency Every 3 days 11/25/2013  5:29 PM  Site / Wound Assessment Red 11/25/2013  5:29 PM  % Wound base Red or Granulating 100% 11/25/2013  5:29 PM  % Wound base Yellow 0% 11/25/2013  5:29 PM  Peri-wound Assessment Hemosiderin;Erythema (blanchable);Edema;Induration;Pink 11/25/2013  5:29 PM  Wound Length (cm) 3 cm 10/25/2013  4:36 PM  Wound Width (cm) 4 cm 10/25/2013  4:36 PM  Wound Depth (cm) 0.1 cm 10/25/2013  4:36 PM  Margins Unattached edges (unapproximated) 11/24/2013  3:37 PM  Closure None 11/25/2013  5:29 PM  Drainage Amount Copious 11/25/2013  5:29 PM  Drainage Description Serous 11/25/2013  5:29 PM  Non-staged Wound Description Not applicable 08/08/3708  6:26 PM  Treatment Cleansed;Debridement (Selective) 11/25/2013  5:29 PM     Wound / Incision (Open or Dehisced) 10/19/13 Leg Right;Anterior (Active)  Dressing Type Alginate;Compression wrap 11/25/2013  5:29 PM  Dressing Changed Changed 11/25/2013  5:29 PM  Dressing Status Clean;Dry;Intact 11/24/2013  3:37 PM  Dressing Change Frequency Every 3 days 11/25/2013  5:29 PM  Site / Wound Assessment Clean 11/25/2013  5:29 PM  %  Wound base Red or Granulating 95% 11/25/2013  5:29 PM  % Wound base Yellow 5% 11/25/2013  5:29 PM  % Wound base Black 0% 11/24/2013  3:37 PM  Peri-wound Assessment Edema;Induration;Pink 11/25/2013  5:29 PM  Wound Length (cm) 0.5 cm 11/19/2013  4:36 PM  Wound Width (cm) 0.2 cm 11/19/2013  4:36 PM  Wound Depth (cm) 0 cm 11/19/2013  4:36 PM  Closure None 11/01/2013  6:00 PM  Drainage Amount Scant 11/25/2013  5:29 PM  Drainage Description Serous 11/25/2013  5:29 PM  Non-staged Wound Description Not applicable 9/48/5462  7:03 PM  Treatment Cleansed;Debridement (Selective) 11/25/2013  5:29 PM   Selective Debridement Selective Debridement - Tools Used: Forceps Selective Debridement - Tissue Removed: Dry skin, throughout B LE    Physical Therapy Assessment and Plan Wound Therapy - Assess/Plan/Recommendations Wound Therapy - Clinical Statement: Pt comes to department with dressing falling to ankle and Lt dressing wet with some maceration.  Pt recieved decongestive manual techniques following debridement routing fluifd from inguinal to axillary anastomosis B.  Pt then had tubular dressing placed over alginate, followed by extra application of cotton to make cone type figure to improve compression wrapping by profore.  Factors Delaying/Impairing Wound Healing: Multiple medical problems;Vascular compromise Hydrotherapy Plan: Dressing change;Patient/family education Wound Plan: Continue with skilled intervention for wound care to Rt LE, check on Lt LE.  Recommend decongestive massage for edema control per PT.        Goals    Problem List Patient Active Problem List   Diagnosis Date Noted  . Bilateral hip joint arthritis 09/06/2013  .  Degeneration of intervertebral disc of lumbar region 09/06/2013  . BACK PAIN 01/24/2009  . CLOSED FRACTURE OF METATARSAL BONE 04/20/2007    GP    Navea Woodrow,CINDY 11/25/2013, 5:36 PM

## 2013-11-26 ENCOUNTER — Ambulatory Visit (HOSPITAL_COMMUNITY): Payer: Medicare Other | Admitting: Physical Therapy

## 2013-11-26 DIAGNOSIS — M129 Arthropathy, unspecified: Secondary | ICD-10-CM | POA: Diagnosis not present

## 2013-11-26 DIAGNOSIS — M4310 Spondylolisthesis, site unspecified: Secondary | ICD-10-CM | POA: Diagnosis not present

## 2013-11-26 DIAGNOSIS — M47817 Spondylosis without myelopathy or radiculopathy, lumbosacral region: Secondary | ICD-10-CM | POA: Diagnosis not present

## 2013-11-26 DIAGNOSIS — R269 Unspecified abnormalities of gait and mobility: Secondary | ICD-10-CM | POA: Diagnosis not present

## 2013-11-30 ENCOUNTER — Ambulatory Visit (HOSPITAL_COMMUNITY)
Admission: RE | Admit: 2013-11-30 | Discharge: 2013-11-30 | Disposition: A | Discharge status: Home / Self Care | Payer: Medicare Other | Source: Ambulatory Visit | Attending: Family Medicine | Admitting: Family Medicine

## 2013-11-30 DIAGNOSIS — IMO0001 Reserved for inherently not codable concepts without codable children: Secondary | ICD-10-CM | POA: Diagnosis not present

## 2013-11-30 NOTE — Progress Notes (Signed)
Physical Therapy - Wound Therapy  Treatment   Patient Details  Name: [name] MRN: [id number] Date of Birth: [birth year]  Today's Date: 11/30/2013 Time: 1600-1705 Time Calculation (min): 65 min Charge:  Manual [id number]  Visit#: 10 of 15  Re-eval: 12/17/13  Subjective Subjective Assessment Subjective: Pt states that her legs feel much better.   Neurosurgeon stated Friday that he would not do back surgery until pt legs were no longer swollen. Date of Onset: 09/16/13  Pain Assessment Pain Assessment Pain Score: 5  Pain Location: Back  Wound Therapy Wound / Incision (Open or Dehisced) 10/18/13 Other (Comment) Leg Left both LE have multiple small open areas that are weeping fluid  (Active)  Dressing Type Alginate;ABD 11/25/2013  5:29 PM  Dressing Changed Changed 11/25/2013  5:29 PM  Dressing Status Clean;Dry;Intact 11/24/2013  3:37 PM  Dressing Change Frequency Every 3 days 11/25/2013  5:29 PM  Site / Wound Assessment Red 11/25/2013  5:29 PM  % Wound base Red or Granulating 100% 11/25/2013  5:29 PM  % Wound base Yellow 0% 11/25/2013  5:29 PM  Peri-wound Assessment Hemosiderin;Erythema (blanchable);Edema;Induration;Pink 11/25/2013  5:29 PM  Wound Length (cm) 3 cm 10/25/2013  4:36 PM  Wound Width (cm) 4 cm 10/25/2013  4:36 PM  Wound Depth (cm) 0.1 cm 10/25/2013  4:36 PM  Margins Unattached edges (unapproximated) 11/24/2013  3:37 PM  Closure None 11/25/2013  5:29 PM  Drainage Amount Copious 11/25/2013  5:29 PM  Drainage Description Serous 11/25/2013  5:29 PM  Non-staged Wound Description Not applicable 0/35/0093  8:18 PM  Treatment Cleansed;Debridement (Selective) 11/25/2013  5:29 PM     Wound / Incision (Open or Dehisced) 10/19/13 Leg Right;Anterior (Active)  Dressing Type Alginate;Compression wrap 11/25/2013  5:29 PM  Dressing Changed Changed 11/25/2013  5:29 PM  Dressing Status Clean;Dry;Intact 11/24/2013  3:37 PM  Dressing Change Frequency Every 3 days 11/25/2013  5:29 PM  Site /  Wound Assessment Clean 11/25/2013  5:29 PM  % Wound base Red or Granulating 95% 11/25/2013  5:29 PM  % Wound base Yellow 5% 11/25/2013  5:29 PM  % Wound base Black 0% 11/24/2013  3:37 PM  Peri-wound Assessment Edema;Induration;Pink 11/25/2013  5:29 PM  Wound Length (cm) 0.5 cm 11/19/2013  4:36 PM  Wound Width (cm) 0.2 cm 11/19/2013  4:36 PM  Wound Depth (cm) 0 cm 11/19/2013  4:36 PM  Closure None 11/01/2013  6:00 PM  Drainage Amount Scant 11/25/2013  5:29 PM  Drainage Description Serous 11/25/2013  5:29 PM  Non-staged Wound Description Not applicable 2/99/3716  9:67 PM  Treatment Cleansed;Debridement (Selective) 11/25/2013  5:29 PM       Physical Therapy Assessment and Plan Wound Therapy - Assess/Plan/Recommendations Wound Therapy - Clinical Statement: Pt appears to no longer have any weeping from LE.  Pt wounds are healed and blisters on Rt LE are now gone.  LE are no longer indurated although they still have edema.  PT recieved decongestive manual using B inguinal-axillary anastomosis.  Pt Lt LE was then wrapped with profore while Rt was wrapped in profore lite.  Pt was told to bring compression stockings as therapist feels she will be ready for discharge next treatment.  Factors Delaying/Impairing Wound Healing: Multiple medical problems;Vascular compromise Hydrotherapy Plan: Dressing change;Patient/family education;Other (comment) (manual ) Wound Plan: anticipate discharge next treatment.        Goals    Problem List Patient Active Problem List   Diagnosis Date Noted  . Bilateral hip joint arthritis 09/06/2013  .  Degeneration of intervertebral disc of lumbar region 09/06/2013  . BACK PAIN 01/24/2009  . CLOSED FRACTURE OF METATARSAL BONE 04/20/2007    GP    Denali Becvar,CINDY 12/01/2013, 8:26 AM

## 2013-12-03 ENCOUNTER — Ambulatory Visit (HOSPITAL_COMMUNITY)
Admission: RE | Admit: 2013-12-03 | Discharge: 2013-12-03 | Disposition: A | Discharge status: Home / Self Care | Payer: Medicare Other | Source: Ambulatory Visit | Attending: Family Medicine | Admitting: Family Medicine

## 2013-12-03 DIAGNOSIS — M7989 Other specified soft tissue disorders: Secondary | ICD-10-CM | POA: Diagnosis not present

## 2013-12-03 DIAGNOSIS — Z5189 Encounter for other specified aftercare: Secondary | ICD-10-CM | POA: Insufficient documentation

## 2013-12-03 DIAGNOSIS — S81802D Unspecified open wound, left lower leg, subsequent encounter: Secondary | ICD-10-CM | POA: Diagnosis not present

## 2013-12-03 NOTE — Progress Notes (Signed)
Physical Therapy - Wound Therapy/ reassessment/discharge.  Evaluation   Patient Details  Name: [name] MRN: [id number] Date of Birth: [birth year]  Today's Date: 12/03/2013 Time: 4967-5916 Time Calculation (min): 75 min Charge:  Manual [id number] Visit#: 11 of 11  Re-eval: 12/17/13  Subjective Subjective Assessment Subjective: Pt states that she brought her compression stockings in case she is ready to be discharged  Patient and Family Stated Goals: LE to not be as swollen nor for there to be any draining from them   Pain Assessment Pain Assessment Pain Score: 4  Pain Location: Back  Wound Therapy Wound / Incision (Open or Dehisced) 10/18/13 Other (Comment) Leg Left both LE have multiple small open areas that are weeping fluid  (Active)- Healed today 12/03/2013  Dressing Type Compression garment  12/03/2013  8:20 AM  Dressing Changed Changed 12/01/2013  8:20 AM  Dressing Status Clean 12/01/2013  8:20 AM  Dressing Change Frequency D/C 12/03/2013  8:20 AM  Site / Wound Assessment Clean 12/01/2013  8:20 AM  % Wound base Red or Granulating 100%-healed 12/01/2013  8:20 AM  % Wound base Yellow 0% 12/01/2013  8:20 AM  Peri-wound Assessment Intact 12/03/2013  4:51 PM  Wound Length (cm) 0 cm 12/01/2013  8:20 AM  Wound Width (cm) 0 cm 12/01/2013  8:20 AM  Wound Depth (cm) 0 cm 12/01/2013  8:20 AM  Margins Unattached edges (unapproximated) 11/24/2013  3:37 PM  Closure None 11/25/2013  5:29 PM  Drainage Amount None 12/03/2013  4:51 PM  Drainage Description none 12/03/2013  5:29 PM  Non-staged Wound Description Not applicable 3/57/0177  9:39 PM  Treatment Cleansed 12/03/2013  4:51 PM     Wound / Incision (Open or Dehisced) 10/19/13 Leg Right;Anterior (Active)  Dressing Type Compression  garment 12/03/2013  8:20 AM  Dressing Changed Changed 12/01/2013  8:20 AM  Dressing Status Clean 12/01/2013  8:20 AM  Dressing Change Frequency Every 3 days 12/01/2013  8:20 AM  Site / Wound Assessment Clean  12/01/2013  8:20 AM  % Wound base Red or Granulating 100% 12/01/2013  8:20 AM  % Wound base Yellow 0% 12/01/2013  8:20 AM  % Wound base Black 0% 11/24/2013  3:37 PM  % Wound base Other (Comment) 0% 12/01/2013  8:20 AM  Peri-wound Assessment Intact 12/03/2013  4:51 PM  Wound Length (cm) healed 12/03/2013  4:36 PM  Wound Width (cm) 0healed 12/03/2013  4:36 PM  Wound Depth (cm) 0 cm 11/19/2013  4:36 PM  Closure None 11/01/2013  6:00 PM  Drainage Amount None 12/03/2013  4:51 PM  Drainage Description none 12/03/2013  5:29 PM  Non-staged Wound Description Not applicable 0/30/0923  3:00 PM  Treatment Cleansed;Other (Comment) 12/03/2013  4:51 PM       Physical Therapy Assessment and Plan Wound Therapy - Assess/Plan/Recommendations Wound Therapy - Clinical Statement: Pt dressings removed and LE appear to be back to normal size.  There is no weeping or evidence of weeping from either LE.  Pt LE cleansed and moisturized followed by manual decongestive techniques with therapist going over different strokes ie pump and circles to pt.  Pt given self massage sheet to allow pt to continue deongestive techniques at home.  Pt  then had compression garments plced on LE.  Pt is no longer in need of skilled therapy services.l  Wound Plan: Discharge pt.      Goals Wound Therapy Goals - Improve the function of patient's integumentary system by progressing the wound(s) through the phases of wound  healing by: Patient/Family will be able to : verbalize the importance of using compression stockings  Patient/Family Instruction Goal - Progress: Met Additional Wound Therapy Goal: Pt Swelling to decrease by 1.5 cm at MTP; 3 cm at ankle and 2cm at lg part of calf to make walking easier.  Additional Wound Therapy Goal - Progress: Met  Problem List Patient Active Problem List   Diagnosis Date Noted  . Bilateral hip joint arthritis 09/06/2013  . Degeneration of intervertebral disc of lumbar region 09/06/2013  . BACK PAIN  01/24/2009  . CLOSED FRACTURE OF METATARSAL BONE 04/20/2007    GP Functional Assessment Tool Used: clinical judgement  Functional Limitation: Other PT primary Other PT Primary Goal Status (F3744): At least 1 percent but less than 20 percent impaired, limited or restricted Other PT Primary Discharge Status [phone]): At least 1 percent but less than 20 percent impaired, limited or restricted  [name],CINDY 12/03/2013, 4:56 PM

## 2013-12-06 ENCOUNTER — Ambulatory Visit (HOSPITAL_COMMUNITY): Payer: Medicare Other | Admitting: Physical Therapy

## 2013-12-10 ENCOUNTER — Ambulatory Visit (HOSPITAL_COMMUNITY): Payer: Medicare Other | Admitting: Physical Therapy

## 2013-12-13 DIAGNOSIS — M47816 Spondylosis without myelopathy or radiculopathy, lumbar region: Secondary | ICD-10-CM | POA: Diagnosis not present

## 2013-12-13 DIAGNOSIS — R2689 Other abnormalities of gait and mobility: Secondary | ICD-10-CM | POA: Diagnosis not present

## 2013-12-13 DIAGNOSIS — M4300 Spondylolysis, site unspecified: Secondary | ICD-10-CM | POA: Diagnosis not present

## 2013-12-13 DIAGNOSIS — M4316 Spondylolisthesis, lumbar region: Secondary | ICD-10-CM | POA: Diagnosis not present

## 2013-12-20 DIAGNOSIS — Z23 Encounter for immunization: Secondary | ICD-10-CM | POA: Diagnosis not present

## 2013-12-28 DIAGNOSIS — Z79818 Long term (current) use of other agents affecting estrogen receptors and estrogen levels: Secondary | ICD-10-CM | POA: Diagnosis not present

## 2013-12-28 DIAGNOSIS — R197 Diarrhea, unspecified: Secondary | ICD-10-CM | POA: Diagnosis not present

## 2013-12-28 DIAGNOSIS — M4316 Spondylolisthesis, lumbar region: Secondary | ICD-10-CM | POA: Diagnosis not present

## 2013-12-28 DIAGNOSIS — Z79899 Other long term (current) drug therapy: Secondary | ICD-10-CM | POA: Diagnosis not present

## 2013-12-28 DIAGNOSIS — M19042 Primary osteoarthritis, left hand: Secondary | ICD-10-CM | POA: Diagnosis not present

## 2013-12-28 DIAGNOSIS — M4317 Spondylolisthesis, lumbosacral region: Secondary | ICD-10-CM | POA: Diagnosis not present

## 2013-12-28 DIAGNOSIS — Z981 Arthrodesis status: Secondary | ICD-10-CM | POA: Diagnosis not present

## 2013-12-28 DIAGNOSIS — Z6833 Body mass index (BMI) 33.0-33.9, adult: Secondary | ICD-10-CM | POA: Diagnosis not present

## 2013-12-28 DIAGNOSIS — G4730 Sleep apnea, unspecified: Secondary | ICD-10-CM | POA: Diagnosis present

## 2013-12-28 DIAGNOSIS — Z4889 Encounter for other specified surgical aftercare: Secondary | ICD-10-CM | POA: Diagnosis not present

## 2013-12-28 DIAGNOSIS — M4716 Other spondylosis with myelopathy, lumbar region: Secondary | ICD-10-CM | POA: Diagnosis not present

## 2013-12-28 DIAGNOSIS — Z823 Family history of stroke: Secondary | ICD-10-CM | POA: Diagnosis not present

## 2013-12-28 DIAGNOSIS — M47816 Spondylosis without myelopathy or radiculopathy, lumbar region: Secondary | ICD-10-CM | POA: Diagnosis not present

## 2013-12-28 DIAGNOSIS — Z833 Family history of diabetes mellitus: Secondary | ICD-10-CM | POA: Diagnosis not present

## 2013-12-28 DIAGNOSIS — M47817 Spondylosis without myelopathy or radiculopathy, lumbosacral region: Secondary | ICD-10-CM | POA: Diagnosis not present

## 2013-12-28 DIAGNOSIS — E668 Other obesity: Secondary | ICD-10-CM | POA: Diagnosis present

## 2013-12-28 DIAGNOSIS — M19041 Primary osteoarthritis, right hand: Secondary | ICD-10-CM | POA: Diagnosis present

## 2013-12-28 DIAGNOSIS — Z7982 Long term (current) use of aspirin: Secondary | ICD-10-CM | POA: Diagnosis not present

## 2013-12-28 DIAGNOSIS — Z86718 Personal history of other venous thrombosis and embolism: Secondary | ICD-10-CM | POA: Diagnosis not present

## 2013-12-28 DIAGNOSIS — M47819 Spondylosis without myelopathy or radiculopathy, site unspecified: Secondary | ICD-10-CM | POA: Diagnosis not present

## 2013-12-28 DIAGNOSIS — M160 Bilateral primary osteoarthritis of hip: Secondary | ICD-10-CM | POA: Diagnosis not present

## 2013-12-28 DIAGNOSIS — I10 Essential (primary) hypertension: Secondary | ICD-10-CM | POA: Diagnosis not present

## 2014-01-03 DIAGNOSIS — M4316 Spondylolisthesis, lumbar region: Secondary | ICD-10-CM | POA: Diagnosis not present

## 2014-01-06 DIAGNOSIS — Z981 Arthrodesis status: Secondary | ICD-10-CM | POA: Diagnosis not present

## 2014-01-06 DIAGNOSIS — Z4889 Encounter for other specified surgical aftercare: Secondary | ICD-10-CM | POA: Diagnosis not present

## 2014-01-06 DIAGNOSIS — M47896 Other spondylosis, lumbar region: Secondary | ICD-10-CM | POA: Diagnosis not present

## 2014-01-06 DIAGNOSIS — Z4789 Encounter for other orthopedic aftercare: Secondary | ICD-10-CM | POA: Diagnosis not present

## 2014-01-06 DIAGNOSIS — I10 Essential (primary) hypertension: Secondary | ICD-10-CM | POA: Diagnosis not present

## 2014-01-10 DIAGNOSIS — I10 Essential (primary) hypertension: Secondary | ICD-10-CM | POA: Diagnosis not present

## 2014-01-10 DIAGNOSIS — Z4789 Encounter for other orthopedic aftercare: Secondary | ICD-10-CM | POA: Diagnosis not present

## 2014-01-12 DIAGNOSIS — Z4789 Encounter for other orthopedic aftercare: Secondary | ICD-10-CM | POA: Diagnosis not present

## 2014-01-12 DIAGNOSIS — I10 Essential (primary) hypertension: Secondary | ICD-10-CM | POA: Diagnosis not present

## 2014-01-14 DIAGNOSIS — I10 Essential (primary) hypertension: Secondary | ICD-10-CM | POA: Diagnosis not present

## 2014-01-14 DIAGNOSIS — Z4789 Encounter for other orthopedic aftercare: Secondary | ICD-10-CM | POA: Diagnosis not present

## 2014-01-17 DIAGNOSIS — Z4789 Encounter for other orthopedic aftercare: Secondary | ICD-10-CM | POA: Diagnosis not present

## 2014-01-17 DIAGNOSIS — I10 Essential (primary) hypertension: Secondary | ICD-10-CM | POA: Diagnosis not present

## 2014-01-19 DIAGNOSIS — I10 Essential (primary) hypertension: Secondary | ICD-10-CM | POA: Diagnosis not present

## 2014-01-19 DIAGNOSIS — Z4789 Encounter for other orthopedic aftercare: Secondary | ICD-10-CM | POA: Diagnosis not present

## 2014-01-21 DIAGNOSIS — Z4789 Encounter for other orthopedic aftercare: Secondary | ICD-10-CM | POA: Diagnosis not present

## 2014-01-21 DIAGNOSIS — I10 Essential (primary) hypertension: Secondary | ICD-10-CM | POA: Diagnosis not present

## 2014-01-23 DIAGNOSIS — I10 Essential (primary) hypertension: Secondary | ICD-10-CM | POA: Diagnosis not present

## 2014-01-23 DIAGNOSIS — Z4789 Encounter for other orthopedic aftercare: Secondary | ICD-10-CM | POA: Diagnosis not present

## 2014-01-24 DIAGNOSIS — I10 Essential (primary) hypertension: Secondary | ICD-10-CM | POA: Diagnosis not present

## 2014-01-24 DIAGNOSIS — Z4789 Encounter for other orthopedic aftercare: Secondary | ICD-10-CM | POA: Diagnosis not present

## 2014-01-25 DIAGNOSIS — Z4789 Encounter for other orthopedic aftercare: Secondary | ICD-10-CM | POA: Diagnosis not present

## 2014-01-25 DIAGNOSIS — I10 Essential (primary) hypertension: Secondary | ICD-10-CM | POA: Diagnosis not present

## 2014-01-31 DIAGNOSIS — I10 Essential (primary) hypertension: Secondary | ICD-10-CM | POA: Diagnosis not present

## 2014-01-31 DIAGNOSIS — Z4789 Encounter for other orthopedic aftercare: Secondary | ICD-10-CM | POA: Diagnosis not present

## 2014-02-01 DIAGNOSIS — I10 Essential (primary) hypertension: Secondary | ICD-10-CM | POA: Diagnosis not present

## 2014-02-01 DIAGNOSIS — Z4789 Encounter for other orthopedic aftercare: Secondary | ICD-10-CM | POA: Diagnosis not present

## 2014-02-02 DIAGNOSIS — I10 Essential (primary) hypertension: Secondary | ICD-10-CM | POA: Diagnosis not present

## 2014-02-02 DIAGNOSIS — Z4789 Encounter for other orthopedic aftercare: Secondary | ICD-10-CM | POA: Diagnosis not present

## 2014-03-24 ENCOUNTER — Ambulatory Visit (INDEPENDENT_AMBULATORY_CARE_PROVIDER_SITE_OTHER): Payer: Medicare Other

## 2014-03-24 ENCOUNTER — Ambulatory Visit (INDEPENDENT_AMBULATORY_CARE_PROVIDER_SITE_OTHER): Payer: Medicare Other | Admitting: Orthopedic Surgery

## 2014-03-24 ENCOUNTER — Encounter: Payer: Self-pay | Admitting: Orthopedic Surgery

## 2014-03-24 VITALS — BP 151/80 | Ht 65.0 in

## 2014-03-24 DIAGNOSIS — M1610 Unilateral primary osteoarthritis, unspecified hip: Secondary | ICD-10-CM

## 2014-03-24 DIAGNOSIS — M1990 Unspecified osteoarthritis, unspecified site: Secondary | ICD-10-CM

## 2014-03-24 NOTE — Progress Notes (Signed)
Chief Complaint  Patient presents with  . Follow-up    Recheck right hip.    This patient recently had lumbar fusion by Dr. Glenna Fellows. [name] had ongoing osteoarthritis of the right hip with possible avascular necrosis and presents now after her hip effusion. Dr. Carloyn Manner has indicated the patient is okay for surgery as far as any concern.  [name] blames of severe right hip pain inability to ambulate without a walker [name] has difficulty getting out of a chair and [name] is using a lift chair to even sleep at night.  [name]'s not taking any medication.  The main factor with her is that [name] has a history of previous blood clot approximately 30 years ago" at [facility] for pulmonary embolism recovered well with no problems over the last 30 years. [name] also has severe venous stasis disease although not weeping at this time.  Past Medical History  Diagnosis Date  . Hypertension    Past Surgical History  Procedure Laterality Date  . Closed redution external fixation right wrist Right   . Appendectomy    . Abdominal hysterectomy    . Tonsillectomy    . Breast biopsy    . Cataract extraction w/phaco Right 09/03/2012    Procedure: CATARACT EXTRACTION PHACO AND INTRAOCULAR LENS PLACEMENT (IOC);  Surgeon: Tonny Branch, MD;  Location: [facility];  Service: Ophthalmology;  Laterality: Right;  CDE: 12.37  . Cataract extraction w/phaco Left 09/28/2012    Procedure: CATARACT EXTRACTION PHACO AND INTRAOCULAR LENS PLACEMENT (IOC);  Surgeon: Tonny Branch, MD;  Location: [facility];  Service: Ophthalmology;  Laterality: Left;  CDE 18.52  . Lumbar fusion      Dr. Carloyn Manner     Current outpatient prescriptions:  .  aspirin EC 81 MG tablet, Take 81 mg by mouth daily., Disp: , Rfl:  .  metoprolol succinate (TOPROL-XL) 100 MG 24 hr tablet, Take 100 mg by mouth daily. Take with or immediately following a meal., Disp: , Rfl:  .  HYDROcodone-acetaminophen (NORCO/VICODIN) 5-325 MG per tablet, Take 1 tablet by mouth every 6 (six) hours as  needed for moderate pain or severe pain., Disp: , Rfl:  .  Nutritional Supplements (ESTROVEN PO), Take 1 tablet by mouth daily., Disp: , Rfl:    Exam findings  BP 151/80 mmHg  Ht 5\' 5"  (1.651 m) Fairly significant obesity. We could not get her on the scale because of her severe disability related to her hip. [name] ambulates with a walker with severe flexion at the lumbosacral spine. [name] is oriented to person place and time her mood and affect are normal. [name] has bilateral venous stasis disease with swelling and discoloration of the skin abnormal texture to the skin but no open lesions or wounds. I did not palpate a distal dorsalis pedis pulse on either limb. [name] does have intact sensation bilaterally. [name] does have significant edema and swelling. Skin again is not weeping just discolored and the texture is poor  [name] does have disease in the left hip as well [name] has painless flexion of the hip with obligatory external rotation and cannot internally rotate the left hip. It is stable. Her muscle tone is normal  Right hip has severe external rotation with flexion no internal rotation. Has painful attempts at internal rotation. Muscle tone and strength are normal.  A radiograph was obtained today we could only get an [facility] of the hip and [name] has significant deformity of the proximal femur at the femoral head with end-stage disease in  terms of the joint  Severe osteoarthritis right hip  Recommend referral to a joint specialist so that the surgery can be done at a tertiary care facility.

## 2014-03-24 NOTE — Patient Instructions (Signed)
Refer to Dr Ninfa Linden @ [facility] Right hip osteoarthritis/replacement

## 2014-03-28 ENCOUNTER — Other Ambulatory Visit: Payer: Self-pay | Admitting: *Deleted

## 2014-03-28 ENCOUNTER — Telehealth: Payer: Self-pay | Admitting: *Deleted

## 2014-03-28 DIAGNOSIS — M1611 Unilateral primary osteoarthritis, right hip: Secondary | ICD-10-CM

## 2014-03-28 NOTE — Telephone Encounter (Signed)
REFERRAL FAXED TO [facility] FOR DR BLACKMAN 

## 2014-04-11 NOTE — Telephone Encounter (Signed)
appt with Dr Ninfa Linden 05/10/14 @ 8:45  States she is seeing a Dr Case in [place] tomorrow and if that appt goes well will cancel with Dr Ninfa Linden

## 2014-04-12 DIAGNOSIS — M160 Bilateral primary osteoarthritis of hip: Secondary | ICD-10-CM | POA: Diagnosis not present

## 2014-04-12 DIAGNOSIS — M87851 Other osteonecrosis, right femur: Secondary | ICD-10-CM | POA: Diagnosis not present

## 2014-04-18 ENCOUNTER — Telehealth: Payer: Self-pay | Admitting: Orthopedic Surgery

## 2014-04-20 NOTE — Telephone Encounter (Signed)
appt cancelled per patient request.

## 2014-04-20 NOTE — Telephone Encounter (Signed)
Cancelled appt with Dr Ninfa Linden per patient request

## 2014-04-21 DIAGNOSIS — R55 Syncope and collapse: Secondary | ICD-10-CM | POA: Diagnosis not present

## 2014-04-21 DIAGNOSIS — I10 Essential (primary) hypertension: Secondary | ICD-10-CM | POA: Diagnosis not present

## 2014-04-21 DIAGNOSIS — M1990 Unspecified osteoarthritis, unspecified site: Secondary | ICD-10-CM | POA: Diagnosis not present

## 2014-04-21 DIAGNOSIS — M5136 Other intervertebral disc degeneration, lumbar region: Secondary | ICD-10-CM | POA: Diagnosis not present

## 2014-04-21 DIAGNOSIS — M810 Age-related osteoporosis without current pathological fracture: Secondary | ICD-10-CM | POA: Diagnosis not present

## 2014-04-21 DIAGNOSIS — M25552 Pain in left hip: Secondary | ICD-10-CM | POA: Diagnosis not present

## 2014-04-21 DIAGNOSIS — K219 Gastro-esophageal reflux disease without esophagitis: Secondary | ICD-10-CM | POA: Diagnosis not present

## 2014-04-21 DIAGNOSIS — Z7982 Long term (current) use of aspirin: Secondary | ICD-10-CM | POA: Diagnosis not present

## 2014-04-21 DIAGNOSIS — M25559 Pain in unspecified hip: Secondary | ICD-10-CM | POA: Diagnosis not present

## 2014-04-21 DIAGNOSIS — M25551 Pain in right hip: Secondary | ICD-10-CM | POA: Diagnosis not present

## 2014-04-27 DIAGNOSIS — Z01818 Encounter for other preprocedural examination: Secondary | ICD-10-CM | POA: Diagnosis not present

## 2014-04-27 DIAGNOSIS — Z6836 Body mass index (BMI) 36.0-36.9, adult: Secondary | ICD-10-CM | POA: Diagnosis not present

## 2014-04-27 DIAGNOSIS — E669 Obesity, unspecified: Secondary | ICD-10-CM | POA: Diagnosis not present

## 2014-04-27 DIAGNOSIS — I10 Essential (primary) hypertension: Secondary | ICD-10-CM | POA: Diagnosis not present

## 2014-04-27 DIAGNOSIS — R791 Abnormal coagulation profile: Secondary | ICD-10-CM | POA: Diagnosis not present

## 2014-04-27 DIAGNOSIS — M1991 Primary osteoarthritis, unspecified site: Secondary | ICD-10-CM | POA: Diagnosis not present

## 2014-05-05 DIAGNOSIS — Z01812 Encounter for preprocedural laboratory examination: Secondary | ICD-10-CM | POA: Diagnosis not present

## 2014-05-05 DIAGNOSIS — M79604 Pain in right leg: Secondary | ICD-10-CM | POA: Diagnosis not present

## 2014-05-05 DIAGNOSIS — M1611 Unilateral primary osteoarthritis, right hip: Secondary | ICD-10-CM | POA: Diagnosis not present

## 2014-05-05 DIAGNOSIS — M87851 Other osteonecrosis, right femur: Secondary | ICD-10-CM | POA: Diagnosis not present

## 2014-05-05 DIAGNOSIS — R350 Frequency of micturition: Secondary | ICD-10-CM | POA: Diagnosis not present

## 2014-05-10 DIAGNOSIS — K219 Gastro-esophageal reflux disease without esophagitis: Secondary | ICD-10-CM | POA: Diagnosis not present

## 2014-05-10 DIAGNOSIS — Z79899 Other long term (current) drug therapy: Secondary | ICD-10-CM | POA: Diagnosis not present

## 2014-05-10 DIAGNOSIS — E669 Obesity, unspecified: Secondary | ICD-10-CM | POA: Diagnosis not present

## 2014-05-10 DIAGNOSIS — Z4789 Encounter for other orthopedic aftercare: Secondary | ICD-10-CM | POA: Diagnosis not present

## 2014-05-10 DIAGNOSIS — Z981 Arthrodesis status: Secondary | ICD-10-CM | POA: Diagnosis not present

## 2014-05-10 DIAGNOSIS — Z7982 Long term (current) use of aspirin: Secondary | ICD-10-CM | POA: Diagnosis not present

## 2014-05-10 DIAGNOSIS — I10 Essential (primary) hypertension: Secondary | ICD-10-CM | POA: Diagnosis present

## 2014-05-10 DIAGNOSIS — G4730 Sleep apnea, unspecified: Secondary | ICD-10-CM | POA: Diagnosis not present

## 2014-05-10 DIAGNOSIS — Z471 Aftercare following joint replacement surgery: Secondary | ICD-10-CM | POA: Diagnosis not present

## 2014-05-10 DIAGNOSIS — K5900 Constipation, unspecified: Secondary | ICD-10-CM | POA: Diagnosis not present

## 2014-05-10 DIAGNOSIS — D649 Anemia, unspecified: Secondary | ICD-10-CM | POA: Diagnosis not present

## 2014-05-10 DIAGNOSIS — N390 Urinary tract infection, site not specified: Secondary | ICD-10-CM | POA: Diagnosis not present

## 2014-05-10 DIAGNOSIS — R278 Other lack of coordination: Secondary | ICD-10-CM | POA: Diagnosis not present

## 2014-05-10 DIAGNOSIS — M87851 Other osteonecrosis, right femur: Secondary | ICD-10-CM | POA: Diagnosis not present

## 2014-05-10 DIAGNOSIS — L309 Dermatitis, unspecified: Secondary | ICD-10-CM | POA: Diagnosis not present

## 2014-05-10 DIAGNOSIS — M62838 Other muscle spasm: Secondary | ICD-10-CM | POA: Diagnosis not present

## 2014-05-10 DIAGNOSIS — M1611 Unilateral primary osteoarthritis, right hip: Secondary | ICD-10-CM | POA: Diagnosis not present

## 2014-05-10 DIAGNOSIS — M80051A Age-related osteoporosis with current pathological fracture, right femur, initial encounter for fracture: Secondary | ICD-10-CM | POA: Diagnosis not present

## 2014-05-10 DIAGNOSIS — R2689 Other abnormalities of gait and mobility: Secondary | ICD-10-CM | POA: Diagnosis not present

## 2014-05-10 DIAGNOSIS — M6281 Muscle weakness (generalized): Secondary | ICD-10-CM | POA: Diagnosis not present

## 2014-05-10 DIAGNOSIS — B372 Candidiasis of skin and nail: Secondary | ICD-10-CM | POA: Diagnosis not present

## 2014-05-10 DIAGNOSIS — Z8614 Personal history of Methicillin resistant Staphylococcus aureus infection: Secondary | ICD-10-CM | POA: Diagnosis not present

## 2014-05-10 DIAGNOSIS — Z96641 Presence of right artificial hip joint: Secondary | ICD-10-CM | POA: Diagnosis not present

## 2014-05-13 DIAGNOSIS — M6281 Muscle weakness (generalized): Secondary | ICD-10-CM | POA: Diagnosis not present

## 2014-05-13 DIAGNOSIS — M62838 Other muscle spasm: Secondary | ICD-10-CM | POA: Diagnosis not present

## 2014-05-13 DIAGNOSIS — R2689 Other abnormalities of gait and mobility: Secondary | ICD-10-CM | POA: Diagnosis not present

## 2014-05-13 DIAGNOSIS — M1990 Unspecified osteoarthritis, unspecified site: Secondary | ICD-10-CM | POA: Diagnosis not present

## 2014-05-13 DIAGNOSIS — L309 Dermatitis, unspecified: Secondary | ICD-10-CM | POA: Diagnosis not present

## 2014-05-13 DIAGNOSIS — Z471 Aftercare following joint replacement surgery: Secondary | ICD-10-CM | POA: Diagnosis not present

## 2014-05-13 DIAGNOSIS — K5900 Constipation, unspecified: Secondary | ICD-10-CM | POA: Diagnosis not present

## 2014-05-13 DIAGNOSIS — E669 Obesity, unspecified: Secondary | ICD-10-CM | POA: Diagnosis not present

## 2014-05-13 DIAGNOSIS — R278 Other lack of coordination: Secondary | ICD-10-CM | POA: Diagnosis not present

## 2014-05-13 DIAGNOSIS — M80051A Age-related osteoporosis with current pathological fracture, right femur, initial encounter for fracture: Secondary | ICD-10-CM | POA: Diagnosis not present

## 2014-05-13 DIAGNOSIS — N390 Urinary tract infection, site not specified: Secondary | ICD-10-CM | POA: Diagnosis not present

## 2014-05-13 DIAGNOSIS — M87851 Other osteonecrosis, right femur: Secondary | ICD-10-CM | POA: Diagnosis not present

## 2014-05-13 DIAGNOSIS — I10 Essential (primary) hypertension: Secondary | ICD-10-CM | POA: Diagnosis not present

## 2014-05-13 DIAGNOSIS — Z96641 Presence of right artificial hip joint: Secondary | ICD-10-CM | POA: Diagnosis not present

## 2014-05-13 DIAGNOSIS — D649 Anemia, unspecified: Secondary | ICD-10-CM | POA: Diagnosis not present

## 2014-05-18 DIAGNOSIS — M1990 Unspecified osteoarthritis, unspecified site: Secondary | ICD-10-CM | POA: Diagnosis not present

## 2014-05-25 DIAGNOSIS — M87851 Other osteonecrosis, right femur: Secondary | ICD-10-CM | POA: Diagnosis not present

## 2014-05-25 DIAGNOSIS — Z96641 Presence of right artificial hip joint: Secondary | ICD-10-CM | POA: Diagnosis not present

## 2014-06-07 DIAGNOSIS — Z96641 Presence of right artificial hip joint: Secondary | ICD-10-CM | POA: Diagnosis not present

## 2014-06-07 DIAGNOSIS — Z471 Aftercare following joint replacement surgery: Secondary | ICD-10-CM | POA: Diagnosis not present

## 2014-06-07 DIAGNOSIS — I10 Essential (primary) hypertension: Secondary | ICD-10-CM | POA: Diagnosis not present

## 2014-06-08 DIAGNOSIS — I10 Essential (primary) hypertension: Secondary | ICD-10-CM | POA: Diagnosis not present

## 2014-06-08 DIAGNOSIS — Z96641 Presence of right artificial hip joint: Secondary | ICD-10-CM | POA: Diagnosis not present

## 2014-06-08 DIAGNOSIS — Z471 Aftercare following joint replacement surgery: Secondary | ICD-10-CM | POA: Diagnosis not present

## 2014-06-09 DIAGNOSIS — Z96641 Presence of right artificial hip joint: Secondary | ICD-10-CM | POA: Diagnosis not present

## 2014-06-09 DIAGNOSIS — Z6837 Body mass index (BMI) 37.0-37.9, adult: Secondary | ICD-10-CM | POA: Diagnosis not present

## 2014-06-13 DIAGNOSIS — Z471 Aftercare following joint replacement surgery: Secondary | ICD-10-CM | POA: Diagnosis not present

## 2014-06-13 DIAGNOSIS — I10 Essential (primary) hypertension: Secondary | ICD-10-CM | POA: Diagnosis not present

## 2014-06-13 DIAGNOSIS — Z96641 Presence of right artificial hip joint: Secondary | ICD-10-CM | POA: Diagnosis not present

## 2014-06-15 DIAGNOSIS — Z471 Aftercare following joint replacement surgery: Secondary | ICD-10-CM | POA: Diagnosis not present

## 2014-06-15 DIAGNOSIS — I10 Essential (primary) hypertension: Secondary | ICD-10-CM | POA: Diagnosis not present

## 2014-06-15 DIAGNOSIS — Z96641 Presence of right artificial hip joint: Secondary | ICD-10-CM | POA: Diagnosis not present

## 2014-06-17 DIAGNOSIS — Z96641 Presence of right artificial hip joint: Secondary | ICD-10-CM | POA: Diagnosis not present

## 2014-06-17 DIAGNOSIS — Z471 Aftercare following joint replacement surgery: Secondary | ICD-10-CM | POA: Diagnosis not present

## 2014-06-17 DIAGNOSIS — I10 Essential (primary) hypertension: Secondary | ICD-10-CM | POA: Diagnosis not present

## 2014-06-20 DIAGNOSIS — I10 Essential (primary) hypertension: Secondary | ICD-10-CM | POA: Diagnosis not present

## 2014-06-20 DIAGNOSIS — Z471 Aftercare following joint replacement surgery: Secondary | ICD-10-CM | POA: Diagnosis not present

## 2014-06-20 DIAGNOSIS — Z96641 Presence of right artificial hip joint: Secondary | ICD-10-CM | POA: Diagnosis not present

## 2014-06-21 DIAGNOSIS — I10 Essential (primary) hypertension: Secondary | ICD-10-CM | POA: Diagnosis not present

## 2014-06-21 DIAGNOSIS — Z471 Aftercare following joint replacement surgery: Secondary | ICD-10-CM | POA: Diagnosis not present

## 2014-06-21 DIAGNOSIS — Z96641 Presence of right artificial hip joint: Secondary | ICD-10-CM | POA: Diagnosis not present

## 2014-06-22 DIAGNOSIS — M1611 Unilateral primary osteoarthritis, right hip: Secondary | ICD-10-CM | POA: Diagnosis not present

## 2014-06-22 DIAGNOSIS — M1612 Unilateral primary osteoarthritis, left hip: Secondary | ICD-10-CM | POA: Diagnosis not present

## 2014-06-22 DIAGNOSIS — M25552 Pain in left hip: Secondary | ICD-10-CM | POA: Diagnosis not present

## 2014-06-22 DIAGNOSIS — Z96649 Presence of unspecified artificial hip joint: Secondary | ICD-10-CM | POA: Diagnosis not present

## 2014-06-24 DIAGNOSIS — Z471 Aftercare following joint replacement surgery: Secondary | ICD-10-CM | POA: Diagnosis not present

## 2014-06-24 DIAGNOSIS — Z96641 Presence of right artificial hip joint: Secondary | ICD-10-CM | POA: Diagnosis not present

## 2014-06-24 DIAGNOSIS — I10 Essential (primary) hypertension: Secondary | ICD-10-CM | POA: Diagnosis not present

## 2014-08-03 DIAGNOSIS — M1611 Unilateral primary osteoarthritis, right hip: Secondary | ICD-10-CM | POA: Diagnosis not present

## 2014-08-03 DIAGNOSIS — Z96641 Presence of right artificial hip joint: Secondary | ICD-10-CM | POA: Diagnosis not present

## 2014-08-03 DIAGNOSIS — M1612 Unilateral primary osteoarthritis, left hip: Secondary | ICD-10-CM | POA: Diagnosis not present

## 2014-08-04 DIAGNOSIS — Z01812 Encounter for preprocedural laboratory examination: Secondary | ICD-10-CM | POA: Diagnosis not present

## 2014-08-04 DIAGNOSIS — M160 Bilateral primary osteoarthritis of hip: Secondary | ICD-10-CM | POA: Diagnosis not present

## 2014-08-04 DIAGNOSIS — R350 Frequency of micturition: Secondary | ICD-10-CM | POA: Diagnosis not present

## 2014-08-11 DIAGNOSIS — M1612 Unilateral primary osteoarthritis, left hip: Secondary | ICD-10-CM | POA: Diagnosis not present

## 2014-08-16 DIAGNOSIS — E6609 Other obesity due to excess calories: Secondary | ICD-10-CM | POA: Diagnosis not present

## 2014-08-16 DIAGNOSIS — R350 Frequency of micturition: Secondary | ICD-10-CM | POA: Diagnosis not present

## 2014-08-16 DIAGNOSIS — N390 Urinary tract infection, site not specified: Secondary | ICD-10-CM | POA: Diagnosis not present

## 2014-08-16 DIAGNOSIS — I10 Essential (primary) hypertension: Secondary | ICD-10-CM | POA: Diagnosis not present

## 2014-08-16 DIAGNOSIS — Z01818 Encounter for other preprocedural examination: Secondary | ICD-10-CM | POA: Diagnosis not present

## 2014-08-16 DIAGNOSIS — M1612 Unilateral primary osteoarthritis, left hip: Secondary | ICD-10-CM | POA: Diagnosis not present

## 2014-08-16 DIAGNOSIS — I878 Other specified disorders of veins: Secondary | ICD-10-CM | POA: Diagnosis not present

## 2014-08-16 DIAGNOSIS — M1991 Primary osteoarthritis, unspecified site: Secondary | ICD-10-CM | POA: Diagnosis not present

## 2014-08-16 DIAGNOSIS — M79605 Pain in left leg: Secondary | ICD-10-CM | POA: Diagnosis not present

## 2014-08-16 DIAGNOSIS — Z833 Family history of diabetes mellitus: Secondary | ICD-10-CM | POA: Diagnosis not present

## 2014-08-18 DIAGNOSIS — M1612 Unilateral primary osteoarthritis, left hip: Secondary | ICD-10-CM | POA: Diagnosis not present

## 2014-08-18 DIAGNOSIS — Z833 Family history of diabetes mellitus: Secondary | ICD-10-CM | POA: Diagnosis not present

## 2014-08-18 DIAGNOSIS — Z01818 Encounter for other preprocedural examination: Secondary | ICD-10-CM | POA: Diagnosis not present

## 2014-08-18 DIAGNOSIS — R350 Frequency of micturition: Secondary | ICD-10-CM | POA: Diagnosis not present

## 2014-08-18 DIAGNOSIS — M79605 Pain in left leg: Secondary | ICD-10-CM | POA: Diagnosis not present

## 2014-08-23 DIAGNOSIS — L309 Dermatitis, unspecified: Secondary | ICD-10-CM | POA: Diagnosis not present

## 2014-08-23 DIAGNOSIS — R262 Difficulty in walking, not elsewhere classified: Secondary | ICD-10-CM | POA: Diagnosis not present

## 2014-08-23 DIAGNOSIS — J069 Acute upper respiratory infection, unspecified: Secondary | ICD-10-CM | POA: Diagnosis not present

## 2014-08-23 DIAGNOSIS — Z471 Aftercare following joint replacement surgery: Secondary | ICD-10-CM | POA: Diagnosis not present

## 2014-08-23 DIAGNOSIS — Z79899 Other long term (current) drug therapy: Secondary | ICD-10-CM | POA: Diagnosis not present

## 2014-08-23 DIAGNOSIS — D649 Anemia, unspecified: Secondary | ICD-10-CM | POA: Diagnosis not present

## 2014-08-23 DIAGNOSIS — M6281 Muscle weakness (generalized): Secondary | ICD-10-CM | POA: Diagnosis not present

## 2014-08-23 DIAGNOSIS — K219 Gastro-esophageal reflux disease without esophagitis: Secondary | ICD-10-CM | POA: Diagnosis not present

## 2014-08-23 DIAGNOSIS — Z87440 Personal history of urinary (tract) infections: Secondary | ICD-10-CM | POA: Diagnosis not present

## 2014-08-23 DIAGNOSIS — B354 Tinea corporis: Secondary | ICD-10-CM | POA: Diagnosis not present

## 2014-08-23 DIAGNOSIS — G8929 Other chronic pain: Secondary | ICD-10-CM | POA: Diagnosis not present

## 2014-08-23 DIAGNOSIS — G4700 Insomnia, unspecified: Secondary | ICD-10-CM | POA: Diagnosis not present

## 2014-08-23 DIAGNOSIS — I10 Essential (primary) hypertension: Secondary | ICD-10-CM | POA: Diagnosis present

## 2014-08-23 DIAGNOSIS — M810 Age-related osteoporosis without current pathological fracture: Secondary | ICD-10-CM | POA: Diagnosis not present

## 2014-08-23 DIAGNOSIS — E871 Hypo-osmolality and hyponatremia: Secondary | ICD-10-CM | POA: Diagnosis not present

## 2014-08-23 DIAGNOSIS — Z96641 Presence of right artificial hip joint: Secondary | ICD-10-CM | POA: Diagnosis not present

## 2014-08-23 DIAGNOSIS — Z96642 Presence of left artificial hip joint: Secondary | ICD-10-CM | POA: Diagnosis not present

## 2014-08-23 DIAGNOSIS — R739 Hyperglycemia, unspecified: Secondary | ICD-10-CM | POA: Diagnosis present

## 2014-08-23 DIAGNOSIS — Z6834 Body mass index (BMI) 34.0-34.9, adult: Secondary | ICD-10-CM | POA: Diagnosis not present

## 2014-08-23 DIAGNOSIS — M1612 Unilateral primary osteoarthritis, left hip: Secondary | ICD-10-CM | POA: Diagnosis not present

## 2014-08-23 DIAGNOSIS — N390 Urinary tract infection, site not specified: Secondary | ICD-10-CM | POA: Diagnosis not present

## 2014-08-23 DIAGNOSIS — M1990 Unspecified osteoarthritis, unspecified site: Secondary | ICD-10-CM | POA: Diagnosis not present

## 2014-08-23 DIAGNOSIS — Z7982 Long term (current) use of aspirin: Secondary | ICD-10-CM | POA: Diagnosis not present

## 2014-08-23 DIAGNOSIS — G4730 Sleep apnea, unspecified: Secondary | ICD-10-CM | POA: Diagnosis not present

## 2014-08-26 DIAGNOSIS — M1990 Unspecified osteoarthritis, unspecified site: Secondary | ICD-10-CM | POA: Diagnosis not present

## 2014-08-26 DIAGNOSIS — M6281 Muscle weakness (generalized): Secondary | ICD-10-CM | POA: Diagnosis not present

## 2014-08-26 DIAGNOSIS — L309 Dermatitis, unspecified: Secondary | ICD-10-CM | POA: Diagnosis not present

## 2014-08-26 DIAGNOSIS — Z471 Aftercare following joint replacement surgery: Secondary | ICD-10-CM | POA: Diagnosis not present

## 2014-08-26 DIAGNOSIS — I10 Essential (primary) hypertension: Secondary | ICD-10-CM | POA: Diagnosis not present

## 2014-08-26 DIAGNOSIS — Z96642 Presence of left artificial hip joint: Secondary | ICD-10-CM | POA: Diagnosis not present

## 2014-08-26 DIAGNOSIS — R262 Difficulty in walking, not elsewhere classified: Secondary | ICD-10-CM | POA: Diagnosis not present

## 2014-08-26 DIAGNOSIS — E871 Hypo-osmolality and hyponatremia: Secondary | ICD-10-CM | POA: Diagnosis not present

## 2014-08-26 DIAGNOSIS — J069 Acute upper respiratory infection, unspecified: Secondary | ICD-10-CM | POA: Diagnosis not present

## 2014-08-26 DIAGNOSIS — G8929 Other chronic pain: Secondary | ICD-10-CM | POA: Diagnosis not present

## 2014-08-26 DIAGNOSIS — R062 Wheezing: Secondary | ICD-10-CM | POA: Diagnosis not present

## 2014-08-26 DIAGNOSIS — M1612 Unilateral primary osteoarthritis, left hip: Secondary | ICD-10-CM | POA: Diagnosis not present

## 2014-08-26 DIAGNOSIS — G4700 Insomnia, unspecified: Secondary | ICD-10-CM | POA: Diagnosis not present

## 2014-09-06 DIAGNOSIS — Z96642 Presence of left artificial hip joint: Secondary | ICD-10-CM | POA: Diagnosis not present

## 2014-09-06 DIAGNOSIS — M1612 Unilateral primary osteoarthritis, left hip: Secondary | ICD-10-CM | POA: Diagnosis not present

## 2014-09-22 DIAGNOSIS — Z96642 Presence of left artificial hip joint: Secondary | ICD-10-CM | POA: Diagnosis not present

## 2014-09-22 DIAGNOSIS — B355 Tinea imbricata: Secondary | ICD-10-CM | POA: Diagnosis not present

## 2014-09-22 DIAGNOSIS — Z6834 Body mass index (BMI) 34.0-34.9, adult: Secondary | ICD-10-CM | POA: Diagnosis not present

## 2014-09-22 DIAGNOSIS — Z1389 Encounter for screening for other disorder: Secondary | ICD-10-CM | POA: Diagnosis not present

## 2014-09-22 DIAGNOSIS — R600 Localized edema: Secondary | ICD-10-CM | POA: Diagnosis not present

## 2014-09-23 DIAGNOSIS — M169 Osteoarthritis of hip, unspecified: Secondary | ICD-10-CM | POA: Diagnosis not present

## 2014-10-04 DIAGNOSIS — M160 Bilateral primary osteoarthritis of hip: Secondary | ICD-10-CM | POA: Diagnosis not present

## 2014-10-04 DIAGNOSIS — Z96642 Presence of left artificial hip joint: Secondary | ICD-10-CM | POA: Diagnosis not present

## 2014-10-04 DIAGNOSIS — Z96641 Presence of right artificial hip joint: Secondary | ICD-10-CM | POA: Diagnosis not present

## 2014-10-27 DIAGNOSIS — Z471 Aftercare following joint replacement surgery: Secondary | ICD-10-CM | POA: Diagnosis not present

## 2014-10-27 DIAGNOSIS — Z96642 Presence of left artificial hip joint: Secondary | ICD-10-CM | POA: Diagnosis not present

## 2014-10-27 DIAGNOSIS — Z96641 Presence of right artificial hip joint: Secondary | ICD-10-CM | POA: Diagnosis not present

## 2014-10-27 DIAGNOSIS — I10 Essential (primary) hypertension: Secondary | ICD-10-CM | POA: Diagnosis not present

## 2014-10-31 DIAGNOSIS — Z471 Aftercare following joint replacement surgery: Secondary | ICD-10-CM | POA: Diagnosis not present

## 2014-10-31 DIAGNOSIS — I10 Essential (primary) hypertension: Secondary | ICD-10-CM | POA: Diagnosis not present

## 2014-10-31 DIAGNOSIS — Z96642 Presence of left artificial hip joint: Secondary | ICD-10-CM | POA: Diagnosis not present

## 2014-10-31 DIAGNOSIS — Z96641 Presence of right artificial hip joint: Secondary | ICD-10-CM | POA: Diagnosis not present

## 2014-11-02 DIAGNOSIS — Z471 Aftercare following joint replacement surgery: Secondary | ICD-10-CM | POA: Diagnosis not present

## 2014-11-02 DIAGNOSIS — Z96641 Presence of right artificial hip joint: Secondary | ICD-10-CM | POA: Diagnosis not present

## 2014-11-02 DIAGNOSIS — Z96642 Presence of left artificial hip joint: Secondary | ICD-10-CM | POA: Diagnosis not present

## 2014-11-02 DIAGNOSIS — I10 Essential (primary) hypertension: Secondary | ICD-10-CM | POA: Diagnosis not present

## 2014-11-08 DIAGNOSIS — Z471 Aftercare following joint replacement surgery: Secondary | ICD-10-CM | POA: Diagnosis not present

## 2014-11-08 DIAGNOSIS — I10 Essential (primary) hypertension: Secondary | ICD-10-CM | POA: Diagnosis not present

## 2014-11-08 DIAGNOSIS — Z96641 Presence of right artificial hip joint: Secondary | ICD-10-CM | POA: Diagnosis not present

## 2014-11-08 DIAGNOSIS — Z96642 Presence of left artificial hip joint: Secondary | ICD-10-CM | POA: Diagnosis not present

## 2014-11-09 DIAGNOSIS — Z96642 Presence of left artificial hip joint: Secondary | ICD-10-CM | POA: Diagnosis not present

## 2014-11-09 DIAGNOSIS — Z471 Aftercare following joint replacement surgery: Secondary | ICD-10-CM | POA: Diagnosis not present

## 2014-11-09 DIAGNOSIS — Z96641 Presence of right artificial hip joint: Secondary | ICD-10-CM | POA: Diagnosis not present

## 2014-11-09 DIAGNOSIS — I10 Essential (primary) hypertension: Secondary | ICD-10-CM | POA: Diagnosis not present

## 2014-11-14 DIAGNOSIS — Z471 Aftercare following joint replacement surgery: Secondary | ICD-10-CM | POA: Diagnosis not present

## 2014-11-14 DIAGNOSIS — Z96642 Presence of left artificial hip joint: Secondary | ICD-10-CM | POA: Diagnosis not present

## 2014-11-14 DIAGNOSIS — Z96641 Presence of right artificial hip joint: Secondary | ICD-10-CM | POA: Diagnosis not present

## 2014-11-14 DIAGNOSIS — I10 Essential (primary) hypertension: Secondary | ICD-10-CM | POA: Diagnosis not present

## 2014-11-16 DIAGNOSIS — Z96642 Presence of left artificial hip joint: Secondary | ICD-10-CM | POA: Diagnosis not present

## 2014-11-16 DIAGNOSIS — Z471 Aftercare following joint replacement surgery: Secondary | ICD-10-CM | POA: Diagnosis not present

## 2014-11-16 DIAGNOSIS — I10 Essential (primary) hypertension: Secondary | ICD-10-CM | POA: Diagnosis not present

## 2014-11-16 DIAGNOSIS — Z96641 Presence of right artificial hip joint: Secondary | ICD-10-CM | POA: Diagnosis not present

## 2014-11-21 DIAGNOSIS — Z96641 Presence of right artificial hip joint: Secondary | ICD-10-CM | POA: Diagnosis not present

## 2014-11-21 DIAGNOSIS — Z96642 Presence of left artificial hip joint: Secondary | ICD-10-CM | POA: Diagnosis not present

## 2014-11-21 DIAGNOSIS — Z471 Aftercare following joint replacement surgery: Secondary | ICD-10-CM | POA: Diagnosis not present

## 2014-11-21 DIAGNOSIS — I10 Essential (primary) hypertension: Secondary | ICD-10-CM | POA: Diagnosis not present

## 2014-12-12 DIAGNOSIS — Z6835 Body mass index (BMI) 35.0-35.9, adult: Secondary | ICD-10-CM | POA: Diagnosis not present

## 2014-12-12 DIAGNOSIS — M1991 Primary osteoarthritis, unspecified site: Secondary | ICD-10-CM | POA: Diagnosis not present

## 2014-12-12 DIAGNOSIS — E6609 Other obesity due to excess calories: Secondary | ICD-10-CM | POA: Diagnosis not present

## 2014-12-12 DIAGNOSIS — Z23 Encounter for immunization: Secondary | ICD-10-CM | POA: Diagnosis not present

## 2014-12-12 DIAGNOSIS — Z1389 Encounter for screening for other disorder: Secondary | ICD-10-CM | POA: Diagnosis not present

## 2015-03-18 IMAGING — CT CT ABD-PELV W/ CM
2 of 4 series · 17 of 46 positions shown, 19 images · IV contrast (Omnipaque 300)
Comparison: None.

CLINICAL DATA: Abdominal pain.  History pancreatitis.

EXAM:
CT ABDOMEN AND PELVIS WITH CONTRAST
TECHNIQUE: Multidetector CT imaging of the abdomen and pelvis was performed
using the standard protocol following bolus administration of
intravenous contrast.
CONTRAST:  25mL OMNIPAQUE IOHEXOL 300 MG/ML SOLN, 100mL OMNIPAQUE
IOHEXOL 300 MG/ML SOLN

[Series 2: abd_pel_with 5.0 b40s · axial · 0.97mm/px · z∈[-438,-42]mm · 14 of 89 slices shown, 16 images]
[im 5/89  soft-tissue]
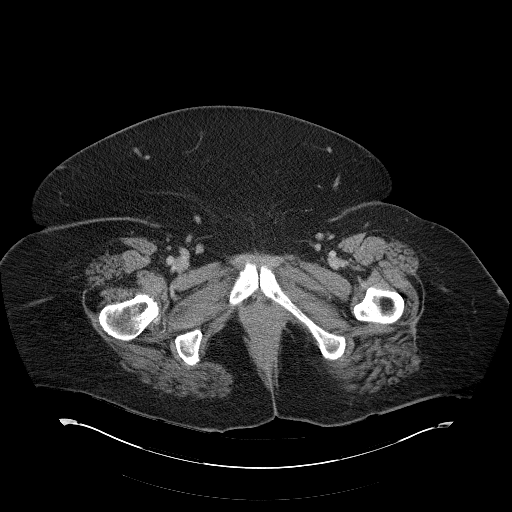
[im 5/89  bone]
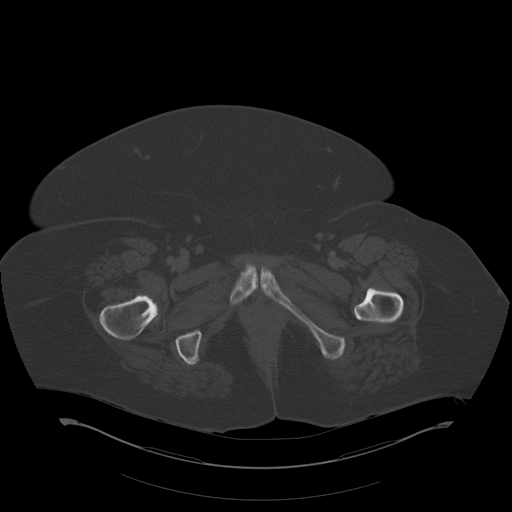
[im 10/89  soft-tissue]
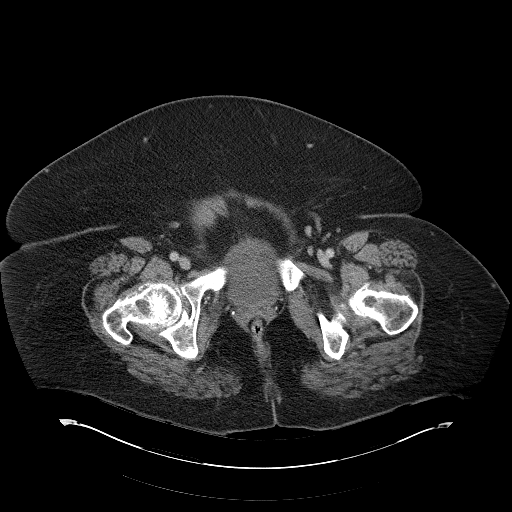
[im 19/89  soft-tissue]
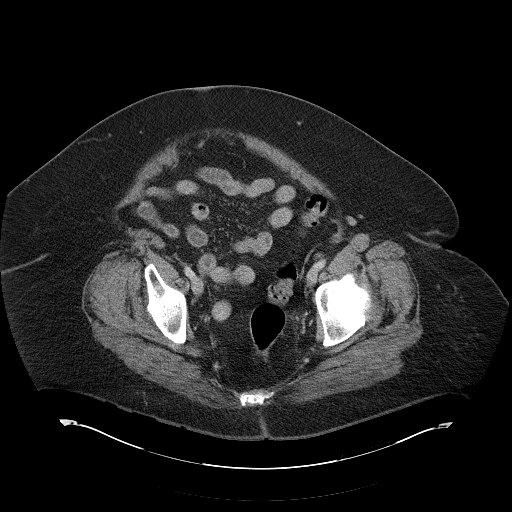
[im 24/89  soft-tissue]
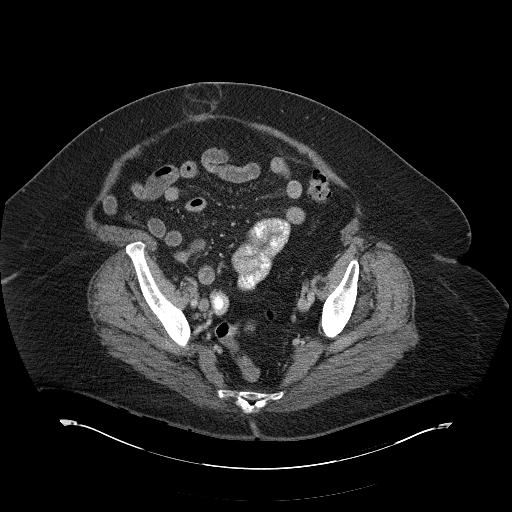
[im 28/89  soft-tissue]
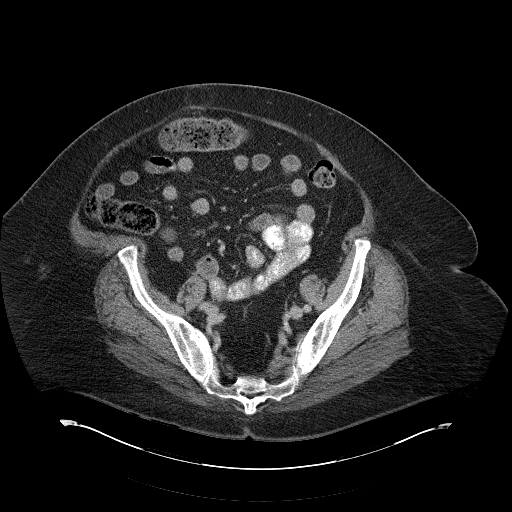
[im 38/89  soft-tissue]
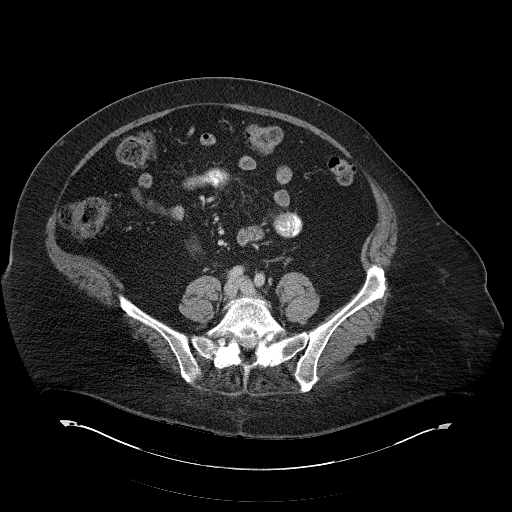
[im 42/89  soft-tissue]
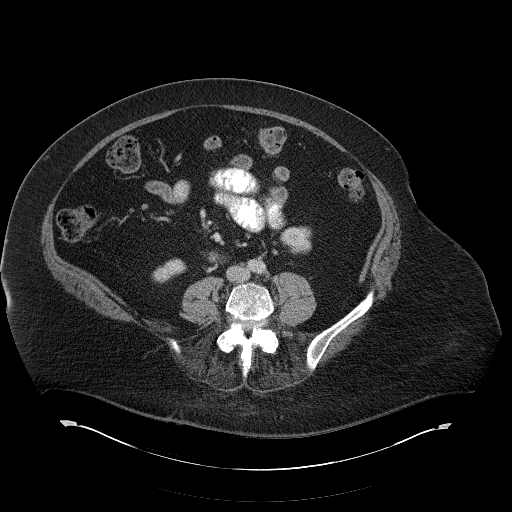
[im 47/89  soft-tissue]
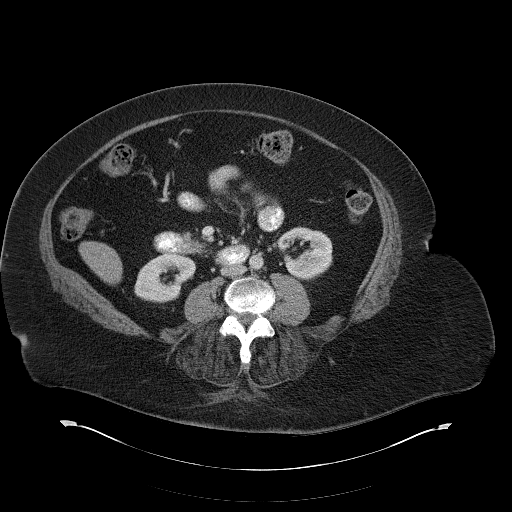
[im 51/89  soft-tissue]
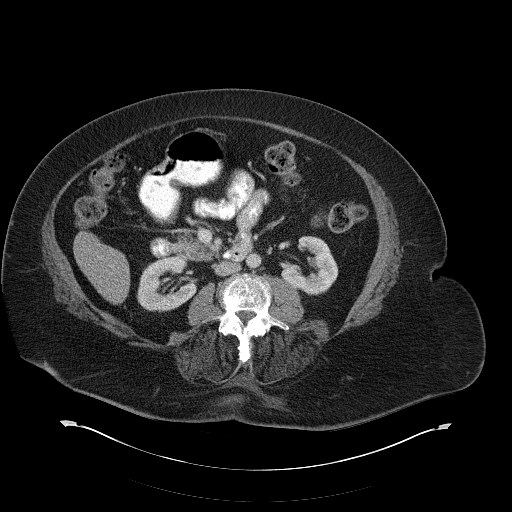
[im 51/89  bone]
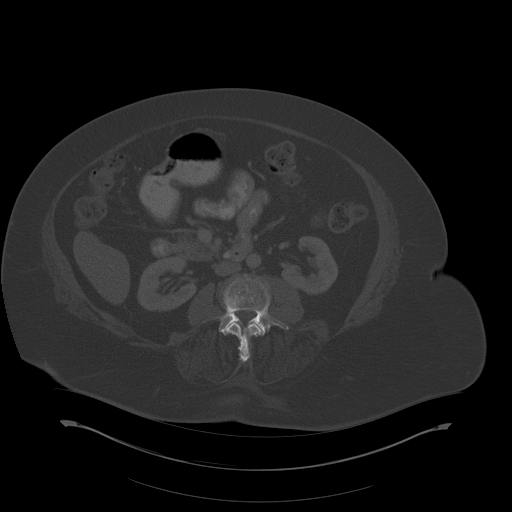
[im 61/89  soft-tissue]
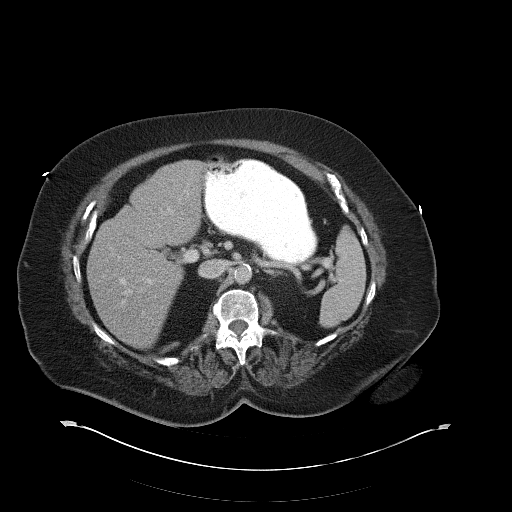
[im 65/89  soft-tissue]
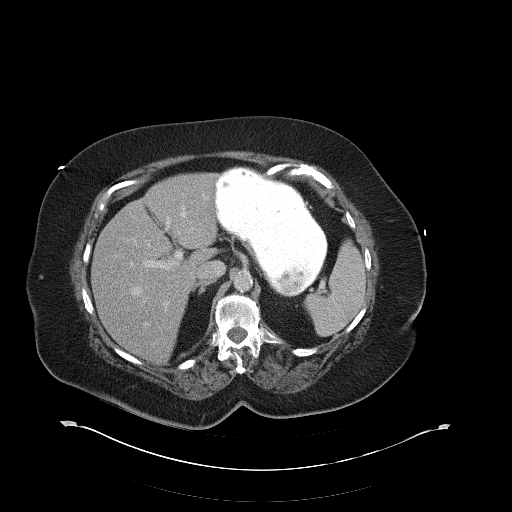
[im 70/89  soft-tissue]
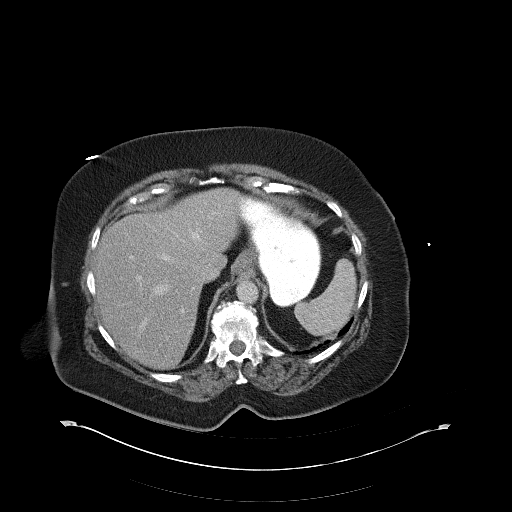
[im 79/89  soft-tissue]
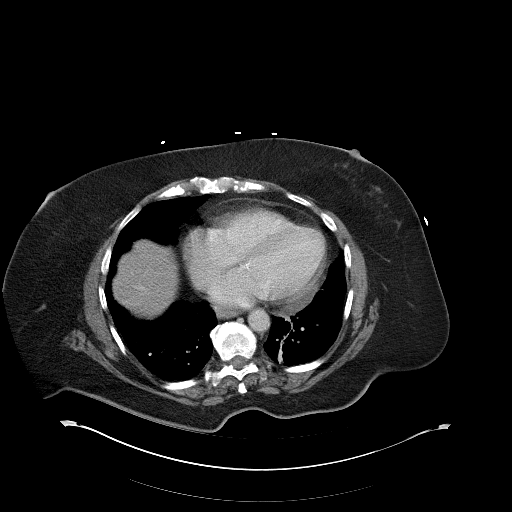
[im 84/89  soft-tissue]
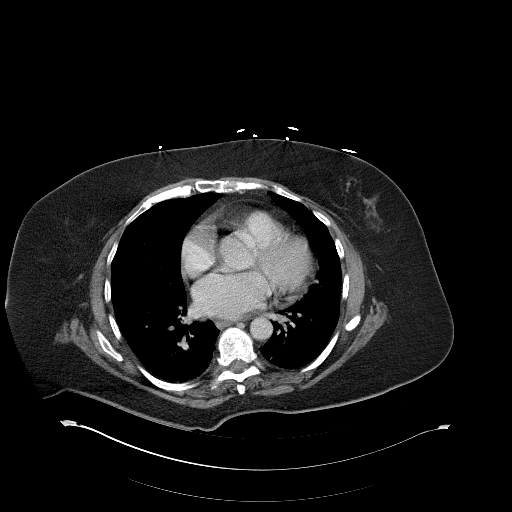

[Series 4: mpr cor post contrast (id) · coronal · 0.93mm/px · 3 of 133 slices shown]
[im 45/133  soft-tissue]
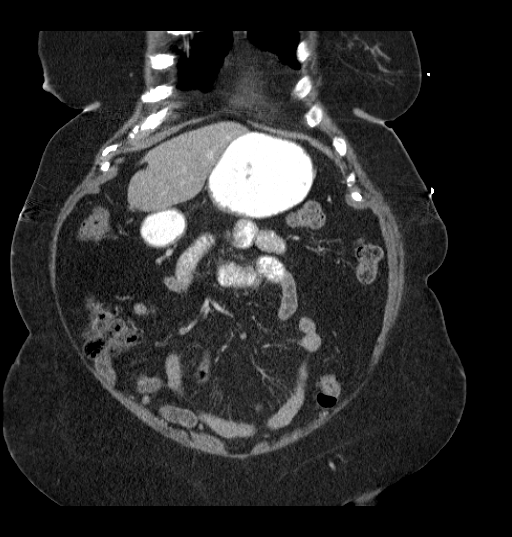
[im 59/133  soft-tissue]
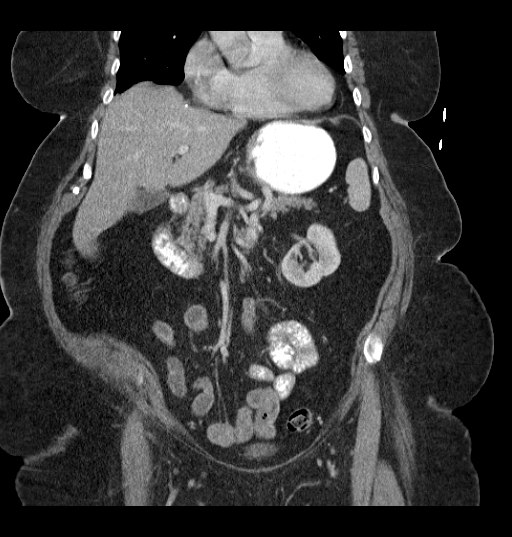
[im 74/133  soft-tissue]
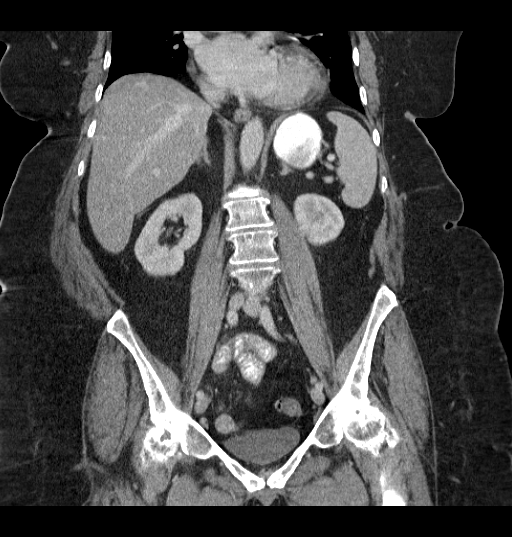

[17 of 46 positions shown; findings below may reference images not displayed]

FINDINGS: BODY WALL: Fatty umbilical hernia.

LOWER CHEST: Low-density fluid accumulation in the posterior
pericardium. Mild atelectasis at the bases.

ABDOMEN/PELVIS:

Liver: Suspect diffuse fatty infiltration of the liver.

Biliary: No evidence of biliary obstruction or stone.

Pancreas: Unremarkable.

Spleen: Nonspecific 8 mm hypoenhancing structure in the posterior
spleen, usually incidental.

Adrenals: Unremarkable.

Kidneys and ureters: 8 mm presumed cyst in the upper pole right
kidney. No hydronephrosis or nephrolithiasis.

Bladder: Unremarkable.

Reproductive: Hysterectomy and probable oophorectomies.

Bowel: No obstruction. Appendectomy.

Retroperitoneum: No mass or adenopathy.

Peritoneum: No free fluid or gas.

Vascular: No acute abnormality.

OSSEOUS: Advanced bilateral hip osteoarthritis with large
subchondral cysts. Advanced lower lumbar facet osteoarthritis with
L4-5 and L5-S1 slip. L1-2 retrolisthesis causing advanced bilateral
foraminal stenosis. Osteitis pubis.
IMPRESSION: 1. No evidence of acute intra-abdominal disease.
2. Incidental findings noted above.

## 2015-04-18 DIAGNOSIS — Z1389 Encounter for screening for other disorder: Secondary | ICD-10-CM | POA: Diagnosis not present

## 2015-04-18 DIAGNOSIS — M1991 Primary osteoarthritis, unspecified site: Secondary | ICD-10-CM | POA: Diagnosis not present

## 2015-04-18 DIAGNOSIS — Z6835 Body mass index (BMI) 35.0-35.9, adult: Secondary | ICD-10-CM | POA: Diagnosis not present

## 2015-04-18 DIAGNOSIS — I10 Essential (primary) hypertension: Secondary | ICD-10-CM | POA: Diagnosis not present

## 2015-04-18 DIAGNOSIS — E782 Mixed hyperlipidemia: Secondary | ICD-10-CM | POA: Diagnosis not present

## 2015-05-23 DIAGNOSIS — M25561 Pain in right knee: Secondary | ICD-10-CM | POA: Diagnosis not present

## 2015-05-23 DIAGNOSIS — M25551 Pain in right hip: Secondary | ICD-10-CM | POA: Diagnosis not present

## 2015-05-23 DIAGNOSIS — Z96642 Presence of left artificial hip joint: Secondary | ICD-10-CM | POA: Diagnosis not present

## 2015-05-23 DIAGNOSIS — M25552 Pain in left hip: Secondary | ICD-10-CM | POA: Diagnosis not present

## 2015-05-23 DIAGNOSIS — Z96641 Presence of right artificial hip joint: Secondary | ICD-10-CM | POA: Diagnosis not present

## 2015-05-23 DIAGNOSIS — M25562 Pain in left knee: Secondary | ICD-10-CM | POA: Diagnosis not present

## 2015-06-01 DIAGNOSIS — K429 Umbilical hernia without obstruction or gangrene: Secondary | ICD-10-CM | POA: Diagnosis not present

## 2015-06-01 DIAGNOSIS — Z6835 Body mass index (BMI) 35.0-35.9, adult: Secondary | ICD-10-CM | POA: Diagnosis not present

## 2015-06-01 DIAGNOSIS — Z1389 Encounter for screening for other disorder: Secondary | ICD-10-CM | POA: Diagnosis not present

## 2015-06-20 DIAGNOSIS — K429 Umbilical hernia without obstruction or gangrene: Secondary | ICD-10-CM | POA: Diagnosis not present

## 2015-06-21 NOTE — H&P (Signed)
  NTS SOAP Note  Vital Signs:  Vitals as of: [id number]: Systolic 0000000: Diastolic 81: Heart Rate 74: Temp 98.59F (Temporal): Height 36ft 6.5in: Weight 213Lbs 0 Ounces: BMI 33.86   BMI : 33.86 kg/m2  Subjective: This 82 year old female presents for of an umbilical hernia.  Has been present for some time, but is increasing in size and causing her discomfort.  Made worse with straining.  Relieved when lying down.  Review of Symptoms:  Constitutional:negative Head:negative    Eyes:negative Nose/Mouth/Throat:negative Cardiovascular:  negative Respiratory:negative Gastrointestinnegative Genitourinary:frequency joint and neck pain Skin:negative Hematolgic/Lymphatic:negative Allergic/Immunologic:negative     Past Medical History:  Reviewed  Past Medical History  Surgical History: bilateral hip replacemet, TAH, bilateral cataract surgery Allergies: nkda Medications: baby asa   Social History:Reviewed  Social History  Preferred Language: English Race:  White Ethnicity: Not Hispanic / Latino Age: 3 year Marital Status:  M Alcohol: no   Smoking Status: Never smoker reviewed on 06/20/2015 Functional Status reviewed on 06/20/2015 ------------------------------------------------ Bathing: Normal Cooking: Normal Dressing: Normal Driving: Normal Eating: Normal Managing Meds: Normal Oral Care: Normal Shopping: Normal Toileting: Normal Transferring: Normal Walking: Normal Cognitive Status reviewed on 06/20/2015 ------------------------------------------------ Attention: Normal Decision Making: Normal Language: Normal Memory: Normal Motor: Normal Perception: Normal Problem Solving: Normal Visual and Spatial: Normal   Family History:Reviewed  Family Health History Mother, Deceased; Diabetes mellitus, unspecified type;  Father, Deceased; Heart disease;     Objective Information: General:Well appearing, well nourished in no  distress. Heart:RRR, no murmur or gallop.  Normal S1, S2.  No S3, S4.  Lungs:  CTA bilaterally, no wheezes, rhonchi, rales.  Breathing unlabored. Abdomen:Soft, NT/ND, no HSM, no masses.  Reducible umbilical hernia noted.  Assessment:Umbilical hernia  Diagnoses: [id number]  [id number] Umbilical hernia (Umbilical hernia without obstruction or gangrene)  Procedures: [id number] - OFFICE OUTPATIENT NEW 30 MINUTES    Plan:  Scheduled for umbilical herniorrhaphy with mesh on 06/30/15.   Patient Education:Alternative treatments to surgery were discussed with patient (and family).  Risks and benefits  of procedure including bleeding, infection, mesh use, and recurrence of the hernia were fully explained to the patient (and family) who gave informed consent. Patient/family questions were addressed.  Follow-up:Pending Surgery

## 2015-06-26 NOTE — Patient Instructions (Signed)
[name]  06/26/2015     @PREFPERIOPPHARMACY @   Your procedure is scheduled on  06/30/2015   Report to [facility] at  52  A.M.  Call this number if you have problems the morning of surgery:  [phone]   Remember:  Do not eat food or drink liquids after midnight.  Take these medicines the morning of surgery with A SIP OF WATER  Norco, metoprolol.   Do not wear jewelry, make-up or nail polish.  Do not wear lotions, powders, or perfumes.  You may wear deodorant.  Do not shave 48 hours prior to surgery.  Men may shave face and neck.  Do not bring valuables to the hospital.  [facility] is not responsible for any belongings or valuables.  Contacts, dentures or bridgework may not be worn into surgery.  Leave your suitcase in the car.  After surgery it may be brought to your room.  For patients admitted to the hospital, discharge time will be determined by your treatment team.  Patients discharged the day of surgery will not be allowed to drive home.   Name and phone number of your driver:   family Special instructions:  none  Please read over the following fact sheets that you were given. Coughing and Deep Breathing, Surgical Site Infection Prevention, Anesthesia Post-op Instructions and Care and Recovery After Surgery      Open Hernia Repair Open hernia repair is surgery to fix a hernia. A hernia occurs when an internal organ or tissue pushes out through a weak spot in the abdominal wall muscles. Hernias commonly occur in the groin and around the navel. Most hernias tend to get worse over time. Surgery is often done to prevent the hernia from getting bigger, becoming uncomfortable, or becoming an emergency. Emergency surgery may be needed if abdominal contents get stuck in the opening (incarcerated hernia) or the blood supply gets cut off (strangulated hernia). In an open repair, a large cut (incision) is made in the abdomen to perform the surgery. LET [facility] CARE PROVIDER KNOW ABOUT:  Any allergies you have.  All medicines you are taking, including vitamins, herbs, eye drops, creams, and over-the-counter medicines.  Previous problems you or members of your family have had with the use of anesthetics.  Any blood disorders you have.  Previous surgeries you have had.  Medical conditions you have. RISKS AND COMPLICATIONS Generally, this is a safe procedure. However, as with any procedure, complications can occur. Possible complications include:  Infection.  Bleeding.  Nerve injury.  Chronic pain.  The hernia can come back.  Injury to the intestines. BEFORE THE PROCEDURE  Ask your health care provider about changing or stopping any regular medicines. Avoid taking aspirin or blood thinners as directed by your health care provider.  Do noteat or drink anything after midnight the night before surgery.  If you smoke, do not smoke for at least 2 weeks before your surgery.  Do not drink alcohol the day before your surgery.  Let your health care provider know if you develop a cold or any infection before your surgery.  Arrange for someone to drive you home after the procedure or after your hospital stay. Also arrange for someone to help you with activities during recovery. PROCEDURE   Small monitors will be put on your body. They are used to check your heart, blood pressure, and oxygen level.   An IV access tube will be put  into one of your veins. Medicine will be able to flow directly into your body through this IV tube.   You might be given a medicine to help you relax (sedative).   You will be given a medicine to make you sleep (general anesthetic). A breathing tube may be placed into your lungs during the procedure.  A cut (incision) is made over the hernia defect, and the contents are pushed back into the abdomen.  If the hernia is small, stitches may be used to bring the muscle edges back together.  Typically, a  surgeon will place a mesh patch made of man-made material (synthetic) to cover the defect. The mesh is sewn to healthy muscle. This reduces the risk of the hernia coming back.  The tissue and skin over the hernia are then closed with stitches or staples.  If the hernia was large, a drain may be left in place to collect excess fluid where the hernia used to be.  Bandages (dressings) are used to cover the incision. AFTER THE PROCEDURE  You will be taken to a recovery area where your progress will be monitored.  If the hernia was small or in the groin (inguinal) region, you will likely be allowed to go home once you are awake, stable, and taking fluids well.  If the hernia was large, you may have to wait for your bowel function to return. You may need to stay in the hospital for 2-3 days until you can eat and your pain is controlled. A drain may be left in place for 5-7 days. You will be taught how to care for the drain.   This information is not intended to replace advice given to you by your health care provider. Make sure you discuss any questions you have with your health care provider.   Document Released: 08/14/2000 Document Revised: 12/09/2012 Document Reviewed: 09/30/2012 Elsevier Interactive Patient Education 2016 [place]. Open Hernia Repair, Care After These instructions give you information about caring for yourself after your procedure. Your doctor may also give you more specific instructions. Call your doctor if you have any problems or questions after your procedure. HOME CARE  Keep the cut (incision) area clean and dry. You may gently wash the incision area with soap and water 48 hours after surgery. To dry the incision area, gently blot or dab it.  Do not take baths, swim, or use a hot tub for 10 days or until your doctor approves.  Change bandages (dressings) as told by your doctor.  Check your incision area every day for signs of infection. Watch for:  Redness,  swelling, or pain.  Fluid , blood, or pus.  Eat plenty of fruits and vegetables. This helps to prevent constipation.  Drink enough fluid to keep your pee (urine) clear or pale yellow. This also helps to prevent constipation.  Do not drive or operate heavy machinery until your doctor says it is okay.  Do not lift anything that is heavier than 10 lb (4.5 kg) until your doctor approves.  Do not play contact sports for 4 weeks or until your doctor approves.  Take medicines only as told by your doctor.  Keep all follow-up visits as told by your doctor. This is important. Ask your doctor when to make an appointment to have your stitches (sutures) or staples removed. GET HELP IF:  The incision is bleeding more than before.  You have blood in your poop (stool).  The incision hurts more than before.  You  have redness, swelling, or pain in your incision area.  You have fluid, blood, or pus coming from your incision.  You have a fever.  You notice a bad smell coming from the incision area or the dressing. GET HELP RIGHT AWAY IF:  You have a rash.  Your chest hurts.  You are short of breath.  You feel light-headed.  You feel weak and dizzy (feel faint).   This information is not intended to replace advice given to you by your health care provider. Make sure you discuss any questions you have with your health care provider.   Document Released: 03/11/2014 Document Reviewed: 03/11/2014 Elsevier Interactive Patient Education 2016 [employer]vier Inc. PATIENT INSTRUCTIONS POST-ANESTHESIA  IMMEDIATELY FOLLOWING SURGERY:  Do not drive or operate machinery for the first twenty four hours after surgery.  Do not make any important decisions for twenty four hours after surgery or while taking narcotic pain medications or sedatives.  If you develop intractable nausea and vomiting or a severe headache please notify your doctor immediately.  FOLLOW-UP:  Please make an appointment with your  surgeon as instructed. You do not need to follow up with anesthesia unless specifically instructed to do so.  WOUND CARE INSTRUCTIONS (if applicable):  Keep a dry clean dressing on the anesthesia/puncture wound site if there is drainage.  Once the wound has quit draining you may leave it open to air.  Generally you should leave the bandage intact for twenty four hours unless there is drainage.  If the epidural site drains for more than 36-48 hours please call the anesthesia department.  QUESTIONS?:  Please feel free to call your physician or the hospital operator if you have any questions, and they will be happy to assist you.

## 2015-06-27 ENCOUNTER — Encounter (HOSPITAL_COMMUNITY): Payer: Self-pay

## 2015-06-27 ENCOUNTER — Encounter (HOSPITAL_COMMUNITY)
Admission: RE | Admit: 2015-06-27 | Discharge: 2015-06-27 | Disposition: A | Discharge status: Home / Self Care | Payer: Medicare Other | Source: Ambulatory Visit | Attending: General Surgery | Admitting: General Surgery

## 2015-06-27 ENCOUNTER — Other Ambulatory Visit: Payer: Self-pay

## 2015-06-27 DIAGNOSIS — Z96643 Presence of artificial hip joint, bilateral: Secondary | ICD-10-CM | POA: Diagnosis not present

## 2015-06-27 DIAGNOSIS — I10 Essential (primary) hypertension: Secondary | ICD-10-CM | POA: Diagnosis not present

## 2015-06-27 DIAGNOSIS — Z01810 Encounter for preprocedural cardiovascular examination: Secondary | ICD-10-CM | POA: Diagnosis not present

## 2015-06-27 DIAGNOSIS — Z7982 Long term (current) use of aspirin: Secondary | ICD-10-CM | POA: Diagnosis not present

## 2015-06-27 DIAGNOSIS — K429 Umbilical hernia without obstruction or gangrene: Secondary | ICD-10-CM | POA: Diagnosis not present

## 2015-06-27 DIAGNOSIS — Z791 Long term (current) use of non-steroidal anti-inflammatories (NSAID): Secondary | ICD-10-CM | POA: Diagnosis not present

## 2015-06-27 LAB — CBC WITH DIFFERENTIAL/PLATELET
BASOS ABS: 0 10*3/uL (ref 0.0–0.1)
BASOS PCT: 1 %
EOS ABS: 0.2 10*3/uL (ref 0.0–0.7)
EOS PCT: 2 %
HCT: 41.9 % (ref 36.0–46.0)
Hemoglobin: 14.3 g/dL (ref 12.0–15.0)
LYMPHS ABS: 2.2 10*3/uL (ref 0.7–4.0)
Lymphocytes Relative: 26 %
MCH: 31.4 pg (ref 26.0–34.0)
MCHC: 34.1 g/dL (ref 30.0–36.0)
MCV: 92.1 fL (ref 78.0–100.0)
Monocytes Absolute: 0.7 10*3/uL (ref 0.1–1.0)
Monocytes Relative: 8 %
NEUTROS PCT: 63 %
Neutro Abs: 5.3 10*3/uL (ref 1.7–7.7)
PLATELETS: 272 10*3/uL (ref 150–400)
RBC: 4.55 MIL/uL (ref 3.87–5.11)
RDW: 12.8 % (ref 11.5–15.5)
WBC: 8.4 10*3/uL (ref 4.0–10.5)

## 2015-06-27 LAB — BASIC METABOLIC PANEL
Anion gap: 11 (ref 5–15)
BUN: 18 mg/dL (ref 6–20)
CO2: 25 mmol/L (ref 22–32)
CREATININE: 0.86 mg/dL (ref 0.44–1.00)
Calcium: 9.2 mg/dL (ref 8.9–10.3)
Chloride: 105 mmol/L (ref 101–111)
GFR calc Af Amer: >60 mL/min (ref >60)
Glucose, Bld: 93 mg/dL (ref 65–99)
POTASSIUM: 4.3 mmol/L (ref 3.5–5.1)
SODIUM: 141 mmol/L (ref 135–145)

## 2015-06-30 ENCOUNTER — Ambulatory Visit (HOSPITAL_COMMUNITY): Payer: Medicare Other | Admitting: Anesthesiology

## 2015-06-30 ENCOUNTER — Ambulatory Visit (HOSPITAL_COMMUNITY)
Admission: RE | Admit: 2015-06-30 | Discharge: 2015-06-30 | Disposition: A | Discharge status: Home / Self Care | Payer: Medicare Other | Source: Ambulatory Visit | Attending: General Surgery | Admitting: General Surgery

## 2015-06-30 ENCOUNTER — Encounter (HOSPITAL_COMMUNITY): Payer: Self-pay | Admitting: *Deleted

## 2015-06-30 ENCOUNTER — Encounter (HOSPITAL_COMMUNITY)
Admission: RE | Disposition: A | Discharge status: Home / Self Care | Payer: Self-pay | Source: Ambulatory Visit | Attending: General Surgery

## 2015-06-30 DIAGNOSIS — I10 Essential (primary) hypertension: Secondary | ICD-10-CM | POA: Diagnosis not present

## 2015-06-30 DIAGNOSIS — Z96643 Presence of artificial hip joint, bilateral: Secondary | ICD-10-CM | POA: Diagnosis not present

## 2015-06-30 DIAGNOSIS — Z7982 Long term (current) use of aspirin: Secondary | ICD-10-CM | POA: Insufficient documentation

## 2015-06-30 DIAGNOSIS — K429 Umbilical hernia without obstruction or gangrene: Secondary | ICD-10-CM | POA: Insufficient documentation

## 2015-06-30 DIAGNOSIS — Z791 Long term (current) use of non-steroidal anti-inflammatories (NSAID): Secondary | ICD-10-CM | POA: Insufficient documentation

## 2015-06-30 DIAGNOSIS — Z01810 Encounter for preprocedural cardiovascular examination: Secondary | ICD-10-CM | POA: Insufficient documentation

## 2015-06-30 HISTORY — PX: UMBILICAL HERNIA REPAIR: SHX196

## 2015-06-30 HISTORY — PX: INSERTION OF MESH: SHX5868

## 2015-06-30 SURGERY — REPAIR, HERNIA, UMBILICAL, ADULT
Anesthesia: General | Site: Abdomen

## 2015-06-30 MED ORDER — PROPOFOL 10 MG/ML IV BOLUS
INTRAVENOUS | Status: AC
  Filled 2015-06-30: qty 20

## 2015-06-30 MED ORDER — LACTATED RINGERS IV SOLN
INTRAVENOUS | Status: DC | PRN
  Administered 2015-06-30: 09:00:00 via INTRAVENOUS

## 2015-06-30 MED ORDER — HYDROCODONE-ACETAMINOPHEN 5-325 MG PO TABS: 1.0000 | ORAL_TABLET | Freq: Four times a day (QID) | ORAL | Status: AC | PRN

## 2015-06-30 MED ORDER — FENTANYL CITRATE (PF) 250 MCG/5ML IJ SOLN
INTRAMUSCULAR | Status: AC
  Filled 2015-06-30: qty 5

## 2015-06-30 MED ORDER — BUPIVACAINE HCL (PF) 0.5 % IJ SOLN
INTRAMUSCULAR | Status: AC
  Filled 2015-06-30: qty 30

## 2015-06-30 MED ORDER — CHLORHEXIDINE GLUCONATE 4 % EX LIQD: 1.0000 "application " | Freq: Once | CUTANEOUS | Status: DC

## 2015-06-30 MED ORDER — ONDANSETRON HCL 4 MG/2ML IJ SOLN: 4.0000 mg | Freq: Once | INTRAMUSCULAR | Status: DC | PRN

## 2015-06-30 MED ORDER — POVIDONE-IODINE 10 % OINT PACKET
TOPICAL_OINTMENT | CUTANEOUS | Status: DC | PRN
  Administered 2015-06-30: 1 via TOPICAL

## 2015-06-30 MED ORDER — ONDANSETRON HCL 4 MG/2ML IJ SOLN
INTRAMUSCULAR | Status: AC
  Filled 2015-06-30: qty 2

## 2015-06-30 MED ORDER — PROPOFOL 10 MG/ML IV BOLUS
INTRAVENOUS | Status: DC | PRN
  Administered 2015-06-30: 20 mg via INTRAVENOUS
  Administered 2015-06-30: 100 mg via INTRAVENOUS
  Administered 2015-06-30: 20 mg via INTRAVENOUS

## 2015-06-30 MED ORDER — ROCURONIUM BROMIDE 50 MG/5ML IV SOLN
INTRAVENOUS | Status: AC
  Filled 2015-06-30: qty 1

## 2015-06-30 MED ORDER — FENTANYL CITRATE (PF) 100 MCG/2ML IJ SOLN: 25.0000 ug | INTRAMUSCULAR | Status: DC | PRN

## 2015-06-30 MED ORDER — ONDANSETRON HCL 4 MG/2ML IJ SOLN
4.0000 mg | Freq: Once | INTRAMUSCULAR | Status: AC
  Administered 2015-06-30: 4 mg via INTRAVENOUS
  Filled 2015-06-30: qty 2

## 2015-06-30 MED ORDER — CEFAZOLIN SODIUM-DEXTROSE 2-4 GM/100ML-% IV SOLN
2.0000 g | INTRAVENOUS | Status: AC
  Administered 2015-06-30: 2 g via INTRAVENOUS
  Filled 2015-06-30: qty 100

## 2015-06-30 MED ORDER — SODIUM CHLORIDE 0.9 % IR SOLN
Status: DC | PRN
  Administered 2015-06-30: 1000 mL

## 2015-06-30 MED ORDER — POVIDONE-IODINE 10 % EX OINT
TOPICAL_OINTMENT | CUTANEOUS | Status: AC
  Filled 2015-06-30: qty 1

## 2015-06-30 MED ORDER — LACTATED RINGERS IV SOLN
INTRAVENOUS | Status: DC
  Administered 2015-06-30: 10:00:00 via INTRAVENOUS

## 2015-06-30 MED ORDER — GLYCOPYRROLATE 0.2 MG/ML IJ SOLN
0.2000 mg | Freq: Once | INTRAMUSCULAR | Status: AC
  Administered 2015-06-30: 0.2 mg via INTRAVENOUS
  Filled 2015-06-30: qty 1

## 2015-06-30 MED ORDER — KETOROLAC TROMETHAMINE 30 MG/ML IJ SOLN
30.0000 mg | Freq: Once | INTRAMUSCULAR | Status: AC
  Administered 2015-06-30: 30 mg via INTRAVENOUS
  Filled 2015-06-30: qty 1

## 2015-06-30 MED ORDER — MIDAZOLAM HCL 2 MG/2ML IJ SOLN
1.0000 mg | INTRAMUSCULAR | Status: DC | PRN
Start: 2015-06-30 — End: 2015-06-30
  Administered 2015-06-30: 2 mg via INTRAVENOUS
  Filled 2015-06-30: qty 2

## 2015-06-30 MED ORDER — FENTANYL CITRATE (PF) 100 MCG/2ML IJ SOLN
INTRAMUSCULAR | Status: DC | PRN
  Administered 2015-06-30 (×7): 25 ug via INTRAVENOUS

## 2015-06-30 MED ORDER — ONDANSETRON HCL 4 MG/2ML IJ SOLN
INTRAMUSCULAR | Status: DC | PRN
  Administered 2015-06-30: 4 mg via INTRAVENOUS

## 2015-06-30 MED ORDER — LIDOCAINE HCL (CARDIAC) 20 MG/ML IV SOLN
INTRAVENOUS | Status: DC | PRN
  Administered 2015-06-30: 50 mg via INTRATRACHEAL

## 2015-06-30 MED ORDER — BUPIVACAINE HCL (PF) 0.5 % IJ SOLN
INTRAMUSCULAR | Status: DC | PRN
  Administered 2015-06-30: 10 mL

## 2015-06-30 SURGICAL SUPPLY — 36 items
BAG HAMPER (MISCELLANEOUS) ×2 IMPLANT
BLADE SURG SZ11 CARB STEEL (BLADE) ×2 IMPLANT
CHLORAPREP W/TINT 26ML (MISCELLANEOUS) ×2 IMPLANT
CLOTH BEACON ORANGE TIMEOUT ST (SAFETY) ×2 IMPLANT
COVER LIGHT HANDLE STERIS (MISCELLANEOUS) ×4 IMPLANT
DECANTER SPIKE VIAL GLASS SM (MISCELLANEOUS) ×2 IMPLANT
ELECT REM PT RETURN 9FT ADLT (ELECTROSURGICAL) ×2
ELECTRODE REM PT RTRN 9FT ADLT (ELECTROSURGICAL) ×1 IMPLANT
GLOVE BIOGEL PI IND STRL 7.0 (GLOVE) ×1 IMPLANT
GLOVE BIOGEL PI INDICATOR 7.0 (GLOVE) ×2
GLOVE ECLIPSE 6.5 STRL STRAW (GLOVE) ×1 IMPLANT
GLOVE ECLIPSE 7.0 STRL STRAW (GLOVE) ×1 IMPLANT
GLOVE EXAM NITRILE MD LF STRL (GLOVE) ×1 IMPLANT
GLOVE SURG SS PI 7.5 STRL IVOR (GLOVE) ×4 IMPLANT
GOWN STRL REUS W/ TWL LRG LVL3 (GOWN DISPOSABLE) ×1 IMPLANT
GOWN STRL REUS W/TWL LRG LVL3 (GOWN DISPOSABLE) ×6 IMPLANT
INST SET MINOR GENERAL (KITS) ×2 IMPLANT
KIT ROOM TURNOVER APOR (KITS) ×2 IMPLANT
MANIFOLD NEPTUNE II (INSTRUMENTS) ×2 IMPLANT
MESH VENTRALEX ST 2.5 CRC MED (Mesh General) ×2 IMPLANT
NDL HYPO 25X1 1.5 SAFETY (NEEDLE) ×1 IMPLANT
NEEDLE HYPO 25X1 1.5 SAFETY (NEEDLE) ×2 IMPLANT
NS IRRIG 1000ML POUR BTL (IV SOLUTION) ×2 IMPLANT
PACK MINOR (CUSTOM PROCEDURE TRAY) ×2 IMPLANT
PAD ARMBOARD 7.5X6 YLW CONV (MISCELLANEOUS) ×2 IMPLANT
SET BASIN LINEN APH (SET/KITS/TRAYS/PACK) ×2 IMPLANT
SPONGE GAUZE 2X2 8PLY STRL LF (GAUZE/BANDAGES/DRESSINGS) ×4 IMPLANT
SPONGE GAUZE 4X4 12PLY (GAUZE/BANDAGES/DRESSINGS) ×1 IMPLANT
STAPLER VISISTAT (STAPLE) ×2 IMPLANT
SUT ETHIBOND NAB MO 7 #0 18IN (SUTURE) ×3 IMPLANT
SUT VIC AB 2-0 CT2 27 (SUTURE) ×2 IMPLANT
SUT VIC AB 3-0 SH 27 (SUTURE) ×2
SUT VIC AB 3-0 SH 27X BRD (SUTURE) ×1 IMPLANT
SUT VICRYL AB 3 0 TIES (SUTURE) IMPLANT
SYR CONTROL 10ML LL (SYRINGE) ×2 IMPLANT
TAPE CLOTH SURG 4X10 WHT LF (GAUZE/BANDAGES/DRESSINGS) ×1 IMPLANT

## 2015-06-30 NOTE — Interval H&P Note (Signed)
History and Physical Interval Note:  06/30/2015 10:09 AM  [name]  has presented today for surgery, with the diagnosis of umbilical hernia  The various methods of treatment have been discussed with the patient and family. After consideration of risks, benefits and other options for treatment, the patient has consented to  Procedure(s): HERNIA REPAIR UMBILICAL ADULT WITH MESH (N/A) as a surgical intervention .  The patient's history has been reviewed, patient examined, no change in status, stable for surgery.  I have reviewed the patient's chart and labs.  Questions were answered to the patient's satisfaction.     Aviva Signs A

## 2015-06-30 NOTE — Discharge Instructions (Signed)
Umbilical Herniorrhaphy, Care After °Refer to this sheet in the next few weeks. These instructions provide you with information on caring for yourself after your procedure. Your health care provider may also give you more specific instructions. Your treatment has been planned according to current medical practices, but problems sometimes occur. Call your health care provider if you have any problems or questions after your procedure. °HOME CARE INSTRUCTIONS °· If you are given antibiotic medicine, take it as directed. Finish it even if you start to feel better. °· Only take over-the-counter or prescription medicines for pain, fever, or discomfort as directed by your health care provider. Do not take aspirin. It can cause bleeding. °· Do not get your surgical cut (incision) area wet unless your health care provider says it is okay. °· Avoid lifting objects heavier than 10 lb (4.5 kg) for 8 weeks after surgery. °· Avoid sexual activity for 5 weeks after surgery or as directed by your health care provider. °· Do not drive while taking prescription pain medicine. °· You may return to your other normal, daily activities after 3 days or as directed by your health care provider. °SEEK MEDICAL CARE IF: °· You notice blood or fluid leaking from the surgical site. °· Your incision area becomes red or swollen. °· Your pain at the surgical site becomes worse or is not relieved by medicine. °· You have problems urinating. °· You feel nauseous or vomit more than 2 days after surgery. °· You notice the bulge in your abdomen returns after the procedure. °SEEK IMMEDIATE MEDICAL CARE IF: °· You have a fever. °· You have nausea or vomiting that will not stop. °  °This information is not intended to replace advice given to you by your health care provider. Make sure you discuss any questions you have with your health care provider. °  °Document Released: 08/20/2011 Document Revised: 03/11/2014 Document Reviewed: 08/20/2011 °Elsevier  Interactive Patient Education ©2016 Elsevier Inc. ° °

## 2015-06-30 NOTE — Anesthesia Postprocedure Evaluation (Signed)
Anesthesia Post Note Late entry  Patient: [name]  Procedure(s) Performed: Procedure(s) (LRB): UMBILICAL HERNIORRHAPHY WITH MESH (N/A) INSERTION OF MESH (N/A)  Patient location during evaluation: PACU Anesthesia Type: General Level of consciousness: awake and alert and oriented Pain management: pain level controlled Vital Signs Assessment: post-procedure vital signs reviewed and stable Respiratory status: spontaneous breathing Cardiovascular status: stable Anesthetic complications: no    Last Vitals:  Filed Vitals:   06/30/15 1230 06/30/15 1245  BP: 154/68 175/75  Pulse: 69 71  Temp: 36.5 C 36.3 C  Resp: 17 18    Last Pain:  Filed Vitals:   06/30/15 1247  PainSc: 4                  ADAMS, AMY A

## 2015-06-30 NOTE — Anesthesia Preprocedure Evaluation (Signed)
Anesthesia Evaluation  Patient identified by MRN, date of birth, ID band Patient awake    Reviewed: Allergy & Precautions, H&P , Patient's Chart, lab work & pertinent test results, reviewed documented beta blocker date and time   Airway Mallampati: II  TM Distance: >3 FB     Dental  (+) Teeth Intact, Implants   Pulmonary neg pulmonary ROS,    breath sounds clear to auscultation       Cardiovascular hypertension (off meds now),  Rhythm:Regular Rate:Normal     Neuro/Psych    GI/Hepatic negative GI ROS,   Endo/Other    Renal/GU      Musculoskeletal   Abdominal   Peds  Hematology   Anesthesia Other Findings   Reproductive/Obstetrics                             Anesthesia Physical Anesthesia Plan  ASA: II  Anesthesia Plan: General   Post-op Pain Management:    Induction: Intravenous  Airway Management Planned: LMA  Additional Equipment:   Intra-op Plan:   Post-operative Plan: Extubation in OR  Informed Consent: I have reviewed the patients History and Physical, chart, labs and discussed the procedure including the risks, benefits and alternatives for the proposed anesthesia with the patient or authorized representative who has indicated his/her understanding and acceptance.     Plan Discussed with:   Anesthesia Plan Comments:         Anesthesia Quick Evaluation

## 2015-06-30 NOTE — Transfer of Care (Signed)
Immediate Anesthesia Transfer of Care Note  Patient: [name]  Procedure(s) Performed: Procedure(s): UMBILICAL HERNIORRHAPHY WITH MESH (N/A) INSERTION OF MESH (N/A)  Patient Location: PACU  Anesthesia Type:General  Level of Consciousness: awake, alert , oriented and patient cooperative  Airway & Oxygen Therapy: Patient Spontanous Breathing and Patient connected to nasal cannula oxygen  Post-op Assessment: Report given to RN and Post -op Vital signs reviewed and stable  Post vital signs: Reviewed and stable  Last Vitals:  Filed Vitals:   06/30/15 0950  Temp: 36.4 C    Last Pain: There were no vitals filed for this visit.    Patients Stated Pain Goal: 6 ([id number])  Complications: No apparent anesthesia complications

## 2015-06-30 NOTE — Op Note (Signed)
Patient:  [name]  DOB:  03-02-1932  MRN:  [id number]   Preop Diagnosis:  Umbilical hernia  Postop Diagnosis:  Same  Procedure:  Umbilical herniorrhaphy with mesh  Surgeon:  Aviva Signs, M.D.  Anes:  Gen.   Indications:  Patient is an 82 year old white female presents with increasing pain and swelling at the umbilicus. The risks and benefits of the procedure including bleeding, infection, mesh use, the possibly recurrence of the hernia were fully explained to the patient, who gave informed consent.  Procedure note:  The patient was placed in the supine position. After general anesthesia was administered, the abdomen was prepped and draped using usual sterile technique with DuraPrep. Surgical site confirmation was performed.  An infraumbilical incision was made down to the fascia. The umbilicus was freed away from the underlying fascia. A 2 cm hernia defect was found. This containing some omentum which was freed away and reduced into the abdominal cavity without difficulty. A 6.4 cm Bard ventralax ST patch was then inserted and secured to the fascia using 0 Ethibond interrupted sutures. The subcutaneous layer was reapproximated using 2-0 Vicryl interrupted sutures. 0.5% Sensorcaine was instilled in the surrounding wound. The skin was closed using staples. Betadine ointment and dry sterile dressings were applied.  All tape and needle counts were correct at the end of the procedure. Patient was awakened and transferred to PACU in stable condition.  Complications:  None  EBL:  Minimal  Specimen:  None

## 2015-10-04 DIAGNOSIS — H26493 Other secondary cataract, bilateral: Secondary | ICD-10-CM | POA: Diagnosis not present

## 2015-10-30 DIAGNOSIS — H26491 Other secondary cataract, right eye: Secondary | ICD-10-CM | POA: Diagnosis not present

## 2015-10-30 DIAGNOSIS — H26492 Other secondary cataract, left eye: Secondary | ICD-10-CM | POA: Diagnosis not present

## 2015-10-30 DIAGNOSIS — H26493 Other secondary cataract, bilateral: Secondary | ICD-10-CM | POA: Diagnosis not present

## 2015-11-27 DIAGNOSIS — Z23 Encounter for immunization: Secondary | ICD-10-CM | POA: Diagnosis not present

## 2015-12-14 DIAGNOSIS — L089 Local infection of the skin and subcutaneous tissue, unspecified: Secondary | ICD-10-CM | POA: Diagnosis not present

## 2015-12-14 DIAGNOSIS — E6609 Other obesity due to excess calories: Secondary | ICD-10-CM | POA: Diagnosis not present

## 2015-12-14 DIAGNOSIS — Z6836 Body mass index (BMI) 36.0-36.9, adult: Secondary | ICD-10-CM | POA: Diagnosis not present

## 2015-12-14 DIAGNOSIS — Z1389 Encounter for screening for other disorder: Secondary | ICD-10-CM | POA: Diagnosis not present

## 2015-12-14 DIAGNOSIS — Z23 Encounter for immunization: Secondary | ICD-10-CM | POA: Diagnosis not present

## 2015-12-15 DIAGNOSIS — Z6837 Body mass index (BMI) 37.0-37.9, adult: Secondary | ICD-10-CM | POA: Diagnosis not present

## 2015-12-15 DIAGNOSIS — L089 Local infection of the skin and subcutaneous tissue, unspecified: Secondary | ICD-10-CM | POA: Diagnosis not present

## 2016-02-20 DIAGNOSIS — M170 Bilateral primary osteoarthritis of knee: Secondary | ICD-10-CM | POA: Diagnosis not present

## 2016-02-20 DIAGNOSIS — M25561 Pain in right knee: Secondary | ICD-10-CM | POA: Diagnosis not present

## 2016-02-20 DIAGNOSIS — M25562 Pain in left knee: Secondary | ICD-10-CM | POA: Diagnosis not present

## 2016-02-20 DIAGNOSIS — G8929 Other chronic pain: Secondary | ICD-10-CM | POA: Diagnosis not present

## 2016-06-11 DIAGNOSIS — Z6837 Body mass index (BMI) 37.0-37.9, adult: Secondary | ICD-10-CM | POA: Diagnosis not present

## 2016-06-11 DIAGNOSIS — R300 Dysuria: Secondary | ICD-10-CM | POA: Diagnosis not present

## 2016-06-11 DIAGNOSIS — Z1389 Encounter for screening for other disorder: Secondary | ICD-10-CM | POA: Diagnosis not present

## 2016-07-03 DIAGNOSIS — M25562 Pain in left knee: Secondary | ICD-10-CM | POA: Diagnosis not present

## 2016-07-03 DIAGNOSIS — M170 Bilateral primary osteoarthritis of knee: Secondary | ICD-10-CM | POA: Diagnosis not present

## 2016-07-03 DIAGNOSIS — M25561 Pain in right knee: Secondary | ICD-10-CM | POA: Diagnosis not present

## 2016-07-11 DIAGNOSIS — L718 Other rosacea: Secondary | ICD-10-CM | POA: Diagnosis not present

## 2016-07-11 DIAGNOSIS — L218 Other seborrheic dermatitis: Secondary | ICD-10-CM | POA: Diagnosis not present

## 2016-09-09 DIAGNOSIS — L309 Dermatitis, unspecified: Secondary | ICD-10-CM | POA: Diagnosis not present

## 2016-09-09 DIAGNOSIS — L982 Febrile neutrophilic dermatosis [Sweet]: Secondary | ICD-10-CM | POA: Diagnosis not present

## 2016-09-09 DIAGNOSIS — Z1389 Encounter for screening for other disorder: Secondary | ICD-10-CM | POA: Diagnosis not present

## 2016-09-09 DIAGNOSIS — Z6837 Body mass index (BMI) 37.0-37.9, adult: Secondary | ICD-10-CM | POA: Diagnosis not present

## 2016-09-12 DIAGNOSIS — Z6837 Body mass index (BMI) 37.0-37.9, adult: Secondary | ICD-10-CM | POA: Diagnosis not present

## 2016-09-12 DIAGNOSIS — L309 Dermatitis, unspecified: Secondary | ICD-10-CM | POA: Diagnosis not present

## 2016-09-26 DIAGNOSIS — E6609 Other obesity due to excess calories: Secondary | ICD-10-CM | POA: Diagnosis not present

## 2016-09-26 DIAGNOSIS — Z6836 Body mass index (BMI) 36.0-36.9, adult: Secondary | ICD-10-CM | POA: Diagnosis not present

## 2016-09-26 DIAGNOSIS — L309 Dermatitis, unspecified: Secondary | ICD-10-CM | POA: Diagnosis not present

## 2016-09-26 DIAGNOSIS — L409 Psoriasis, unspecified: Secondary | ICD-10-CM | POA: Diagnosis not present

## 2016-10-16 DIAGNOSIS — L0100 Impetigo, unspecified: Secondary | ICD-10-CM | POA: Diagnosis not present

## 2016-10-31 DIAGNOSIS — L0100 Impetigo, unspecified: Secondary | ICD-10-CM | POA: Diagnosis not present

## 2016-11-27 DIAGNOSIS — M25562 Pain in left knee: Secondary | ICD-10-CM | POA: Diagnosis not present

## 2016-11-27 DIAGNOSIS — Z96643 Presence of artificial hip joint, bilateral: Secondary | ICD-10-CM | POA: Diagnosis not present

## 2016-11-27 DIAGNOSIS — Z23 Encounter for immunization: Secondary | ICD-10-CM | POA: Diagnosis not present

## 2016-11-27 DIAGNOSIS — M170 Bilateral primary osteoarthritis of knee: Secondary | ICD-10-CM | POA: Diagnosis not present

## 2016-11-27 DIAGNOSIS — Z6834 Body mass index (BMI) 34.0-34.9, adult: Secondary | ICD-10-CM | POA: Diagnosis not present

## 2016-11-27 DIAGNOSIS — M25561 Pain in right knee: Secondary | ICD-10-CM | POA: Diagnosis not present

## 2017-03-18 DIAGNOSIS — R600 Localized edema: Secondary | ICD-10-CM | POA: Diagnosis not present

## 2017-03-18 DIAGNOSIS — A46 Erysipelas: Secondary | ICD-10-CM | POA: Diagnosis not present

## 2017-03-18 DIAGNOSIS — Z6837 Body mass index (BMI) 37.0-37.9, adult: Secondary | ICD-10-CM | POA: Diagnosis not present

## 2017-03-21 ENCOUNTER — Telehealth (HOSPITAL_COMMUNITY): Payer: Self-pay | Admitting: Physical Therapy

## 2017-03-21 DIAGNOSIS — Z1389 Encounter for screening for other disorder: Secondary | ICD-10-CM | POA: Diagnosis not present

## 2017-03-21 DIAGNOSIS — A46 Erysipelas: Secondary | ICD-10-CM | POA: Diagnosis not present

## 2017-03-21 DIAGNOSIS — Z6836 Body mass index (BMI) 36.0-36.9, adult: Secondary | ICD-10-CM | POA: Diagnosis not present

## 2017-03-21 DIAGNOSIS — E6609 Other obesity due to excess calories: Secondary | ICD-10-CM | POA: Diagnosis not present

## 2017-03-21 NOTE — Telephone Encounter (Signed)
Dr. Hilma Favors wants patient to be seen asap, Left leg being primary and tx bilateral legs as tx is scheduled in the future. NF 03/21/17

## 2017-03-25 ENCOUNTER — Encounter (HOSPITAL_COMMUNITY): Payer: Self-pay | Admitting: Physical Therapy

## 2017-03-25 ENCOUNTER — Ambulatory Visit (HOSPITAL_COMMUNITY): Payer: Medicare Other | Attending: Family Medicine | Admitting: Physical Therapy

## 2017-03-25 DIAGNOSIS — I890 Lymphedema, not elsewhere classified: Secondary | ICD-10-CM | POA: Insufficient documentation

## 2017-03-25 NOTE — Therapy (Signed)
[facility] [address] [place], [place], [address] Phone: [phone]   Fax:  [phone]  Physical Therapy Evaluation  Patient Details  Name: [name] MRN: [id number] Date of Birth: 1931-07-13 Referring Provider: Sharilyn Sites    Encounter Date: 03/25/2017  PT End of Session - 03/25/17 1504    Visit Number  1    Number of Visits  1    Authorization Type  medicare    Authorization - Visit Number  1    Authorization - Number of Visits  1    PT Start Time  2536    PT Stop Time  1203    PT Time Calculation (min)  48 min    Activity Tolerance  Patient tolerated treatment well    Behavior During Therapy  [facility] for tasks assessed/performed       Past Medical History:  Diagnosis Date  . Hypertension     Past Surgical History:  Procedure Laterality Date  . ABDOMINAL HYSTERECTOMY    . APPENDECTOMY    . BREAST BIOPSY    . CATARACT EXTRACTION W/PHACO Right 09/03/2012   Procedure: CATARACT EXTRACTION PHACO AND INTRAOCULAR LENS PLACEMENT (IOC);  Surgeon: Tonny Branch, MD;  Location: [facility];  Service: Ophthalmology;  Laterality: Right;  CDE: 12.37  . CATARACT EXTRACTION W/PHACO Left 09/28/2012   Procedure: CATARACT EXTRACTION PHACO AND INTRAOCULAR LENS PLACEMENT (IOC);  Surgeon: Tonny Branch, MD;  Location: [facility];  Service: Ophthalmology;  Laterality: Left;  CDE 18.52  . closed redution external fixation right wrist Right   . INSERTION OF MESH N/A 06/30/2015   Procedure: INSERTION OF MESH;  Surgeon: Aviva Signs, MD;  Location: [facility];  Service: General;  Laterality: N/A;  . JOINT REPLACEMENT     [facility]ilateral Hips at [facility]  . LUMBAR FUSION     Dr. Carloyn Manner  . TONSILLECTOMY    . UMBILICAL HERNIA REPAIR N/A 06/30/2015   Procedure: UMBILICAL HERNIORRHAPHY WITH MESH;  Surgeon: Aviva Signs, MD;  Location: [facility];  Service: General;  Laterality: N/A;    There were no vitals filed for this visit.   Subjective Assessment - 03/25/17 1257    Subjective  [name] states that two weeks ago she was without power for four days.  She was walking in the dark and hit her right leg on the table causing a gash in her right leg.  She has been going to the MD for care but they thought since she had lymphedema as well a certified lymphedema therapist should look at it.  She states that it hurt like crazy when she did it but she is not having any pain now.    Pertinent History  lymphedema     How long can you sit comfortably?  no problem    How long can you stand comfortably?  15 minutes    How long can you walk comfortably?  15 minutes    Currently in Pain?  No/denies         [facility] PT Assessment - 03/25/17 0001      Assessment   Medical Diagnosis  non healing wound with lympedema    Referring Provider  Sharilyn Sites     Onset Date/Surgical Date  03/09/17    Next MD Visit  not scheducled     Prior Therapy  none      Precautions   Precautions  None      Restrictions   Weight Bearing Restrictions  No  Balance Screen   Has the patient fallen in the past 6 months  No    Has the patient had a decrease in activity level because of a fear of falling?   No    Is the patient reluctant to leave their home because of a fear of falling?   No             Objective measurements completed on examination: See above findings.      [facility] PT Treatment/Exercise - 03/25/17 0001      Manual Therapy   Manual Therapy  Manual Lymphatic Drainage (MLD)    Manual Lymphatic Drainage (MLD)  supraclavicular, deep and superfical abdominal, routing fluid using inguinal/axillary anastomosis and LE B              PT Education - 03/25/17 1503    Education provided  Yes    Education Details  The importance of wearing her compression garments and how to use a compression pump     Person(s) Educated  Patient    Comprehension  Verbalized understanding       PT Short Term Goals - 03/25/17 1513      PT SHORT TERM GOAL #1   Title   Pt to recieve and be using a compression pump    Time  2    Period  Weeks    Status  New    Target Date  04/08/17                Plan - 03/25/17 1505    Clinical Impression Statement  [name] is an 84 yo female who had a nonhealing wound on her Lt LE after running into a table.  She had been being seen by the MD for dressing changes and was then referred to skilled therapy due to the fact that she has ongoing lymphedema as well.  She did not have her compression garments on and the dressing placed on her Lt LE for her wound started 5 inches below her knee and went to 3 inches above her ankle.  She had another dressing on her Lt to match which she stated the MD had placed "just so I would be symmetrical, there is no wound there".  Due to her lymphedema she has bottle neck swelling present.  When the therapist took the dressing off the wound on her Lt LE was healed but both LE had bottle neck swelling.  A decongestive lymphatic massage was performed to decrease the bottle neck swelling.  The therapist inquired if [name] had a compression pump which she does not.  Therapistt explained the pump and stated that she feels [name] would benefit from using a compression pump.  This was agreed upon and the therapist faxed a request to [place].  [name] has compression garments and is knowledgable about lymphedema there is no longer a wound therefore she is not in need of skilled therapy at this time. Marland Kitchen     History and Personal Factors relevant to plan of care:  Lymphedma.    Clinical Presentation  Stable    Clinical Decision Making  Low    Rehab Potential  Good    PT Frequency  1x / week    PT Duration  -- 1 week     PT Treatment/Interventions  ADLs/Self Care Home Management    PT Next Visit Plan  Discharge from therapy.     Consulted and Agree with Plan of Care  Patient       Patient will benefit from skilled therapeutic intervention in order to improve the following deficits  and impairments:  Increased edema  Visit Diagnosis: Lymphedema, not elsewhere classified     Problem List Patient Active Problem List   Diagnosis Date Noted  . Bilateral hip joint arthritis 09/06/2013  . Degeneration of intervertebral disc of lumbar region 09/06/2013  . BACK PAIN 01/24/2009  . CLOSED FRACTURE OF METATARSAL BONE 04/20/2007    Rayetta Humphrey, PT CLT [phone] 03/25/2017, 3:15 PM  [facility] [address] [place], [place], [address] Phone: [phone]   Fax:  [phone]  Name: [name] MRN: [id number] Date of Birth: [birth year]

## 2017-04-01 ENCOUNTER — Ambulatory Visit (HOSPITAL_COMMUNITY): Payer: Medicare Other

## 2017-04-03 ENCOUNTER — Ambulatory Visit (HOSPITAL_COMMUNITY): Payer: Medicare Other

## 2017-04-07 ENCOUNTER — Ambulatory Visit (HOSPITAL_COMMUNITY): Payer: Medicare Other | Admitting: Physical Therapy

## 2017-04-09 ENCOUNTER — Ambulatory Visit (HOSPITAL_COMMUNITY): Payer: Medicare Other

## 2017-04-11 ENCOUNTER — Ambulatory Visit (HOSPITAL_COMMUNITY): Payer: Medicare Other

## 2017-04-14 ENCOUNTER — Ambulatory Visit (HOSPITAL_COMMUNITY): Payer: Medicare Other | Admitting: Physical Therapy

## 2017-04-16 ENCOUNTER — Ambulatory Visit (HOSPITAL_COMMUNITY): Payer: Medicare Other

## 2017-04-18 ENCOUNTER — Ambulatory Visit (HOSPITAL_COMMUNITY): Payer: Medicare Other | Admitting: Physical Therapy

## 2017-05-28 ENCOUNTER — Telehealth (HOSPITAL_COMMUNITY): Payer: Self-pay | Admitting: Physical Therapy

## 2017-05-28 NOTE — Telephone Encounter (Signed)
Pt's last progress note as requested from [facility] to [facility] [phone]. NF 05/28/17

## 2017-07-02 DIAGNOSIS — M25562 Pain in left knee: Secondary | ICD-10-CM | POA: Diagnosis not present

## 2017-07-02 DIAGNOSIS — M170 Bilateral primary osteoarthritis of knee: Secondary | ICD-10-CM | POA: Diagnosis not present

## 2017-07-02 DIAGNOSIS — M25561 Pain in right knee: Secondary | ICD-10-CM | POA: Diagnosis not present

## 2017-07-02 DIAGNOSIS — Z6834 Body mass index (BMI) 34.0-34.9, adult: Secondary | ICD-10-CM | POA: Diagnosis not present

## 2017-07-02 DIAGNOSIS — Z96643 Presence of artificial hip joint, bilateral: Secondary | ICD-10-CM | POA: Diagnosis not present

## 2017-07-08 DIAGNOSIS — E6609 Other obesity due to excess calories: Secondary | ICD-10-CM | POA: Diagnosis not present

## 2017-07-08 DIAGNOSIS — Z6834 Body mass index (BMI) 34.0-34.9, adult: Secondary | ICD-10-CM | POA: Diagnosis not present

## 2017-07-08 DIAGNOSIS — J302 Other seasonal allergic rhinitis: Secondary | ICD-10-CM | POA: Diagnosis not present

## 2017-07-08 DIAGNOSIS — Z1389 Encounter for screening for other disorder: Secondary | ICD-10-CM | POA: Diagnosis not present

## 2017-07-08 DIAGNOSIS — I10 Essential (primary) hypertension: Secondary | ICD-10-CM | POA: Diagnosis not present

## 2017-07-08 DIAGNOSIS — R7309 Other abnormal glucose: Secondary | ICD-10-CM | POA: Diagnosis not present

## 2017-07-21 DIAGNOSIS — L728 Other follicular cysts of the skin and subcutaneous tissue: Secondary | ICD-10-CM | POA: Diagnosis not present

## 2017-07-21 DIAGNOSIS — L821 Other seborrheic keratosis: Secondary | ICD-10-CM | POA: Diagnosis not present

## 2017-07-21 DIAGNOSIS — D225 Melanocytic nevi of trunk: Secondary | ICD-10-CM | POA: Diagnosis not present

## 2017-07-21 DIAGNOSIS — L820 Inflamed seborrheic keratosis: Secondary | ICD-10-CM | POA: Diagnosis not present

## 2017-08-20 DIAGNOSIS — L820 Inflamed seborrheic keratosis: Secondary | ICD-10-CM | POA: Diagnosis not present

## 2017-08-20 DIAGNOSIS — L308 Other specified dermatitis: Secondary | ICD-10-CM | POA: Diagnosis not present

## 2017-10-15 DIAGNOSIS — I878 Other specified disorders of veins: Secondary | ICD-10-CM | POA: Diagnosis not present

## 2017-10-15 DIAGNOSIS — M170 Bilateral primary osteoarthritis of knee: Secondary | ICD-10-CM | POA: Diagnosis not present

## 2017-10-16 ENCOUNTER — Ambulatory Visit (HOSPITAL_COMMUNITY): Payer: Medicare Other | Attending: Orthopedic Surgery

## 2017-10-16 ENCOUNTER — Other Ambulatory Visit: Payer: Self-pay

## 2017-10-16 ENCOUNTER — Encounter (HOSPITAL_COMMUNITY): Payer: Self-pay

## 2017-10-16 DIAGNOSIS — R601 Generalized edema: Secondary | ICD-10-CM | POA: Diagnosis not present

## 2017-10-16 DIAGNOSIS — I890 Lymphedema, not elsewhere classified: Secondary | ICD-10-CM | POA: Diagnosis not present

## 2017-10-16 NOTE — Therapy (Signed)
[place], [place], [address] Phone: [phone]   Fax:  [phone]  Wound Care Evaluation  Patient Details  Name: [name] MRN: [id number] Date of Birth: [birth year] Referring Provider: Case, Reche Dixon, MD   Encounter Date: 10/16/2017  PT End of Session - 10/16/17 1602    Visit Number  1    Number of Visits  13    Date for PT Re-Evaluation  11/27/17    Authorization Type  Medicare PArt A and B, BCBS Supplement (no auth required, no visit limit)    Authorization Time Period  10/16/17 - 11/27/17    Authorization - Visit Number  1    Authorization - Number of Visits  10    PT Start Time  1300    PT Stop Time  1415    PT Time Calculation (min)  75 min    Activity Tolerance  Patient tolerated treatment well    Behavior During Therapy  [facility] for tasks assessed/performed       Past Medical History:  Diagnosis Date  . Hypertension     Past Surgical History:  Procedure Laterality Date  . ABDOMINAL HYSTERECTOMY    . APPENDECTOMY    . BREAST BIOPSY    . CATARACT EXTRACTION W/PHACO Right 09/03/2012   Procedure: CATARACT EXTRACTION PHACO AND INTRAOCULAR LENS PLACEMENT (IOC);  Surgeon: Tonny Branch, MD;  Location: [facility];  Service: Ophthalmology;  Laterality: Right;  CDE: 12.37  . CATARACT EXTRACTION W/PHACO Left 09/28/2012   Procedure: CATARACT EXTRACTION PHACO AND INTRAOCULAR LENS PLACEMENT (IOC);  Surgeon: Tonny Branch, MD;  Location: [facility];  Service: Ophthalmology;  Laterality: Left;  CDE 18.52  . closed redution external fixation right wrist Right   . INSERTION OF MESH N/A 06/30/2015   Procedure: INSERTION OF MESH;  Surgeon: Aviva Signs, MD;  Location: [facility];  Service: General;  Laterality: N/A;  . JOINT REPLACEMENT     bilateral Hips at [facility]  . LUMBAR FUSION     Dr. Carloyn Manner  . TONSILLECTOMY    . UMBILICAL HERNIA REPAIR N/A 06/30/2015   Procedure: UMBILICAL HERNIORRHAPHY WITH MESH;  Surgeon: Aviva Signs, MD;   Location: [facility];  Service: General;  Laterality: N/A;    There were no vitals filed for this visit.  Subjective Assessment - 10/16/17 1431    Subjective  Patient reports she has had LE swelling for several years and that she has had blisters present on bil LE for several months. She reports yesterday her dog jump on her leg and scratched her, which caused a wound resulting in bleeding and then "water" draining from her leg. She reports she has not been able to wear her compression garments for over a year because she has too much difficulty putting them on and needs assistance for that. She states she does have a sock butler but it does not help enough for her. She also has a compression pump but is using it 3 times a week and states it is difficult to set up alone.    Pertinent History  lymphedema secondary to venous insufficiency    Limitations  Walking;House hold activities   cannont don/doff compression stockings   How long can you sit comfortably?  no problem    How long can you stand comfortably?  15 minutes    How long can you walk comfortably?  15 minutes    Patient Stated Goals  get legs smaller and to stop  draining    Currently in Pain?  No/denies       [facility] PT Assessment - 10/16/17 0001      Assessment   Medical Diagnosis  Bil LE Lymphedema wiht Reflux secondary to venous insufficiency    Referring Provider  Case, Reche Dixon, MD    Onset Date/Surgical Date  --   approximately 3 months ago   Prior Therapy  none      Precautions   Precautions  None      Restrictions   Weight Bearing Restrictions  No      Balance Screen   Has the patient fallen in the past 6 months  No    Has the patient had a decrease in activity level because of a fear of falling?   No    Is the patient reluctant to leave their home because of a fear of falling?   No      Prior Function   Level of Independence  Independent with household mobility with device;Independent with community mobility with  device   SPC   Leisure  has a 16 year old yorkie dog who she plays with      Cognition   Overall Cognitive Status  Within Functional Limits for tasks assessed      Wound Therapy - 10/16/17 1618    Subjective  Patient reports she has had LE swelling for several years and that she has had blisters present on bil LE for several months. She reports yesterday her dog jump on her leg and scratched her, which caused a wound resulting in bleeding and then "water" draining from her leg. She reports she has not been able to wear her compression garments for over a year because she has too much difficulty putting them on and needs assistance for that. She states she does have a sock butler but it does not help enough for her. She also has a compression pump but is using it 3 times a week and states it is difficult to set up alone.    Patient and Family Stated Goals  get legs smaller and to stop draining    Date of Onset  --   several months ago   Prior Treatments  treatment in our clinic 3 years ago, compression stockings and compression pump    Evaluation and Treatment Procedures Explained to Patient/Family  Yes    Evaluation and Treatment Procedures  agreed to    Wound Therapy - Clinical Statement  Ms. Cortese presents for wound evaluation after being scratched on her Lt leg by her dog yesterday causing her to bleed and her legs to weep fluid. She presents with bil LE swelling and diffuse blisters along anterior lower leg bil. No open wounds/ulcers are present this date. She was educated on importance of wearing compression garments 84 to prevent edema and on use of compression pump that she has at home. She will benefit from further education on compression garment care and strategies for donning/doffing as well as education on how to use compression pump. She could benefit form decongestive lymphatic massage by certified lymphedema therapist and will require compression wrapping to address lymphedema with  reflux. Profore wrap was applied to bil LE this date and alginate dressing was placed on top of her blisters to prevent maceration from drainage.     Factors Delaying/Impairing Wound Healing  Multiple medical problems;Vascular compromise    Hydrotherapy Plan  Dressing change;Patient/family education;Debridement   debridement only if wounds develop from present blisters  Wound Therapy - Frequency  2X / week    Wound Therapy - Current Recommendations  PT    Wound Plan  measure LE circumference 1x/week. Wrap with profore dressing and apply alginate PRN to prevent maceration from blisters/reflux.         LYMPHEDEMA/ONCOLOGY QUESTIONNAIRE - 10/16/17 1616      Lymphedema Assessments   Lymphedema Assessments  Lower extremities      Right Lower Extremity Lymphedema   At Midpatella/Popliteal Crease  47.1 cm    30 cm Proximal to Floor at Lateral Plantar Foot  44.9 cm    20 cm Proximal to Floor at Lateral Plantar Foot  35.3 1    10  cm Proximal to Floor at Lateral Malleoli  26.5 cm    Circumference of ankle/heel  35.3 cm.    5 cm Proximal to 1st MTP Joint  24.5 cm    Across MTP Joint  24.7 cm    Around Proximal Great Toe  9.9 cm      Left Lower Extremity Lymphedema   At Midpatella/Popliteal Crease  52.3 cm    30 cm Proximal to Floor at Lateral Plantar Foot  46 cm    20 cm Proximal to Floor at Lateral Plantar Foot  37.2 cm    10 cm Proximal to Floor at Lateral Malleoli  27.2 cm    Circumference of ankle/heel  36.2 cm.    5 cm Proximal to 1st MTP Joint  24.5 cm    Across MTP Joint  24.8 cm    Around Proximal Great Toe  9 cm        Objective measurements completed on examination: See above findings.      PT Education - 10/16/17 1600    Education provided  Yes    Education Details  Educated to keep dressing dry and on until next visit. Educated to remove if they become soaked from getting wet, and to remove 1 layer at a time if she looses sensation in her toes, her toes turn a dark  color, or she develops severe pain in her feet/legs.     Person(s) Educated  Patient    Methods  Explanation    Comprehension  Verbalized understanding       PT Short Term Goals - 10/16/17 1606      PT SHORT TERM GOAL #1   Title  Patient will bring in sock butler and compression stockings to problem solve with therapist on best way to don/soff garments to prepare for independence with managing lymphedema on discharge.    Time  3    Period  Weeks    Status  New    Target Date  11/06/17        PT Long Term Goals - 10/16/17 1607      PT LONG TERM GOAL #1   Title  Patient will demonstrate compliance with compression pump 5x/week and wearing compression stockings for final week of therapy to prevent return of edema and blisters from to demonstrate improved independence with lymphedema management/self-care and readiness for discharge.    Time  6    Period  Weeks    Status  New    Target Date  11/27/17        Plan - 10/16/17 1603    Clinical Impression Statement  see above    History and Personal Factors relevant to plan of care:  lymphedema    Clinical Presentation  Stable    Clinical Decision Making  Low  Rehab Potential  Good    PT Frequency  2x / week    PT Duration  6 weeks    PT Treatment/Interventions  ADLs/Self Care Home Management;Vasopneumatic Device;Manual lymph drainage;Compression bandaging;Patient/family education;Manual techniques    PT Next Visit Plan  measure LE circumference 1x/week. Wrap with profore dressing and apply alginate PRN to prevent maceration from blisters/reflux.     Consulted and Agree with Plan of Care  Patient       Patient will benefit from skilled therapeutic intervention in order to improve the following deficits and impairments:  Increased edema, Decreased mobility, Decreased activity tolerance  Visit Diagnosis: Lymphedema, not elsewhere classified  Generalized edema    Problem List Patient Active Problem List   Diagnosis Date  Noted  . Bilateral hip joint arthritis 09/06/2013  . Degeneration of intervertebral disc of lumbar region 09/06/2013  . BACK PAIN 01/24/2009  . CLOSED FRACTURE OF METATARSAL BONE 04/20/2007    Kipp Brood, PT, DPT Physical Therapist with [facility]  10/16/2017 4:33 PM    [place], [place], [address] Phone: [phone]   Fax:  [phone]  Name: LIANN SPAETH MRN: [id number] Date of Birth: Oct 04, 1931

## 2017-10-21 ENCOUNTER — Ambulatory Visit (HOSPITAL_COMMUNITY): Payer: Medicare Other | Admitting: Physical Therapy

## 2017-10-21 DIAGNOSIS — R601 Generalized edema: Secondary | ICD-10-CM

## 2017-10-21 DIAGNOSIS — I890 Lymphedema, not elsewhere classified: Secondary | ICD-10-CM

## 2017-10-21 NOTE — Therapy (Signed)
[place], [place], [address] Phone: [phone]   Fax:  [phone]  Physical Therapy Treatment  Patient Details  Name: [name] MRN: [id number] Date of Birth: 04-18-1931 Referring Provider: Case, Reche Dixon, MD   Encounter Date: 10/21/2017  PT End of Session - 10/21/17 1113    Visit Number  2    Number of Visits  13    Date for PT Re-Evaluation  11/27/17    Authorization Type  Medicare PArt A and B, BCBS Supplement (no auth required, no visit limit)    Authorization Time Period  10/16/17 - 11/27/17    Authorization - Visit Number  2    Authorization - Number of Visits  10    PT Start Time  0950    PT Stop Time  1100    PT Time Calculation (min)  70 min    Activity Tolerance  Patient tolerated treatment well    Behavior During Therapy  [facility] for tasks assessed/performed       Past Medical History:  Diagnosis Date  . Hypertension     Past Surgical History:  Procedure Laterality Date  . ABDOMINAL HYSTERECTOMY    . APPENDECTOMY    . BREAST BIOPSY    . CATARACT EXTRACTION W/PHACO Right 09/03/2012   Procedure: CATARACT EXTRACTION PHACO AND INTRAOCULAR LENS PLACEMENT (IOC);  Surgeon: Tonny Branch, MD;  Location: [facility];  Service: Ophthalmology;  Laterality: Right;  CDE: 12.37  . CATARACT EXTRACTION W/PHACO Left 09/28/2012   Procedure: CATARACT EXTRACTION PHACO AND INTRAOCULAR LENS PLACEMENT (IOC);  Surgeon: Tonny Branch, MD;  Location: [facility];  Service: Ophthalmology;  Laterality: Left;  CDE 18.52  . closed redution external fixation right wrist Right   . INSERTION OF MESH N/A 06/30/2015   Procedure: INSERTION OF MESH;  Surgeon: Aviva Signs, MD;  Location: [facility];  Service: General;  Laterality: N/A;  . JOINT REPLACEMENT     bilateral Hips at [facility]  . LUMBAR FUSION     Dr. Carloyn Manner  . TONSILLECTOMY    . UMBILICAL HERNIA REPAIR N/A 06/30/2015   Procedure: UMBILICAL HERNIORRHAPHY WITH MESH;  Surgeon: Aviva Signs,  MD;  Location: [facility];  Service: General;  Laterality: N/A;    There were no vitals filed for this visit.  Subjective Assessment - 10/21/17 0955    Subjective  PT states that her legs are feeling better but the wraps were a little tight.     Pertinent History  lymphedema secondary to venous insufficiency    Limitations  Walking;House hold activities   cannont don/doff compression stockings   How long can you sit comfortably?  no problem    How long can you stand comfortably?  15 minutes    How long can you walk comfortably?  15 minutes    Patient Stated Goals  get legs smaller and to stop draining    Currently in Pain?  No/denies                       [facility] Adult PT Treatment/Exercise - 10/21/17 0001      Exercises   Exercises  --   To improve lymphedema circulation: ankle pump, LAQ Marching,HIp IR/ER and diaphragm breathing      Manual Therapy   Manual Therapy  Manual Lymphatic Drainage (MLD);Compression Bandaging    Manual therapy comments  done seperate from all other aspects of treatment     Manual Lymphatic Drainage (  MLD)  time restraints therefore therapist only instructed pt on upper thigh manual techniques.    Compression Bandaging  due to pt complaint of bulkiness and heat 1/4" foam was cut for compression bandaging with 1 6cm, 1 8 cm and 2 10 cm used on each LE .  LE were cleansed and moisturized prior to application of compression bandaging .              PT Education - 10/21/17 1113    Education provided  Yes    Education Details  pump technique to lateral thigh followed by moving fluid from medial to lateral followed by pumping lateral thigh and LE exercises.     Person(s) Educated  Patient    Methods  Explanation;Demonstration    Comprehension  Verbalized understanding;Returned demonstration;Need further instruction       PT Short Term Goals - 10/21/17 1118      PT SHORT TERM GOAL #1   Title  Patient will bring in sock butler and  compression stockings to problem solve with therapist on best way to don/soff garments to prepare for independence with managing lymphedema on discharge.    Time  3    Period  Weeks    Status  On-going        PT Long Term Goals - 10/21/17 1118      PT LONG TERM GOAL #1   Title  Patient will demonstrate compliance with compression pump 5x/week and wearing compression stockings for final week of therapy to prevent return of edema and blisters from to demonstrate improved independence with lymphedema management/self-care and readiness for discharge.    Time  6    Period  Weeks    Status  On-going            Plan - 10/21/17 1114    Clinical Impression Statement  PT no longer weeping from Lt LE.  PT volume has reduced significantly but continues to have increased volume in thigh and upper leg  PT will continue to benefit from skilled physical therapy for total decongestive techniques as well as education on self manual techniques and management of lymphedema.  Pt has a compression pump which she does not use every day and garments which she will need to be educated how to don and doff as she is not wearing them at this time.  Therapist cut 1/4" foam and used multilayer compression bandaging .    Clinical Presentation  Stable    Rehab Potential  Good    PT Frequency  2x / week    PT Duration  6 weeks    PT Treatment/Interventions  ADLs/Self Care Home Management;Vasopneumatic Device;Manual lymph drainage;Compression bandaging;Patient/family education;Manual techniques    PT Next Visit Plan  measure LE circumference 1x/week. Continue with total decongestive techniques.     Consulted and Agree with Plan of Care  Patient       Patient will benefit from skilled therapeutic intervention in order to improve the following deficits and impairments:  Increased edema, Decreased mobility, Decreased activity tolerance  Visit Diagnosis: Lymphedema, not elsewhere classified  Generalized  edema     Problem List Patient Active Problem List   Diagnosis Date Noted  . Bilateral hip joint arthritis 09/06/2013  . Degeneration of intervertebral disc of lumbar region 09/06/2013  . BACK PAIN 01/24/2009  . CLOSED FRACTURE OF METATARSAL BONE 04/20/2007    Rayetta Humphrey, PT CLT [phone] 10/21/2017, 11:19 AM  [facility]  [place], [place], [address] Phone: [phone]   Fax:  [phone]  Name: [name] MRN: [id number] Date of Birth: 20-May-1931

## 2017-10-23 ENCOUNTER — Ambulatory Visit (HOSPITAL_COMMUNITY): Payer: Medicare Other | Admitting: Physical Therapy

## 2017-10-23 DIAGNOSIS — I890 Lymphedema, not elsewhere classified: Secondary | ICD-10-CM

## 2017-10-23 DIAGNOSIS — R601 Generalized edema: Secondary | ICD-10-CM | POA: Diagnosis not present

## 2017-10-23 NOTE — Therapy (Signed)
[place], [place], [address] Phone: [phone]   Fax:  [phone]  Physical Therapy Treatment  Patient Details  Name: [name] MRN: [id number] Date of Birth: [birth year] Referring Provider: Case, Reche Dixon, MD   Encounter Date: 10/23/2017  PT End of Session - 10/23/17 1650    Visit Number  3    Number of Visits  13    Date for PT Re-Evaluation  11/27/17    Authorization Type  Medicare PArt A and B, BCBS Supplement (no auth required, no visit limit)    Authorization Time Period  10/16/17 - 11/27/17    Authorization - Visit Number  3    Authorization - Number of Visits  10    PT Start Time  1520    PT Stop Time  1630    PT Time Calculation (min)  70 min    Activity Tolerance  Patient tolerated treatment well    Behavior During Therapy  [facility] for tasks assessed/performed       Past Medical History:  Diagnosis Date  . Hypertension     Past Surgical History:  Procedure Laterality Date  . ABDOMINAL HYSTERECTOMY    . APPENDECTOMY    . BREAST BIOPSY    . CATARACT EXTRACTION W/PHACO Right 09/03/2012   Procedure: CATARACT EXTRACTION PHACO AND INTRAOCULAR LENS PLACEMENT (IOC);  Surgeon: Tonny Branch, MD;  Location: [facility];  Service: Ophthalmology;  Laterality: Right;  CDE: 12.37  . CATARACT EXTRACTION W/PHACO Left 09/28/2012   Procedure: CATARACT EXTRACTION PHACO AND INTRAOCULAR LENS PLACEMENT (IOC);  Surgeon: Tonny Branch, MD;  Location: [facility];  Service: Ophthalmology;  Laterality: Left;  CDE 18.52  . closed redution external fixation right wrist Right   . INSERTION OF MESH N/A 06/30/2015   Procedure: INSERTION OF MESH;  Surgeon: Aviva Signs, MD;  Location: [facility];  Service: General;  Laterality: N/A;  . JOINT REPLACEMENT     bilateral Hips at [facility]  . LUMBAR FUSION     Dr. Carloyn Manner  . TONSILLECTOMY    . UMBILICAL HERNIA REPAIR N/A 06/30/2015   Procedure: UMBILICAL HERNIORRHAPHY WITH MESH;  Surgeon: Aviva Signs,  MD;  Location: [facility];  Service: General;  Laterality: N/A;    There were no vitals filed for this visit.  Subjective Assessment - 10/23/17 1532    Subjective  Pt states her legs are heavy with all the wraps on it. No pain or issues.     Currently in Pain?  No/denies            LYMPHEDEMA/ONCOLOGY QUESTIONNAIRE - 10/23/17 1534      Lymphedema Assessments   Lymphedema Assessments  Lower extremities  (Pended)       Right Lower Extremity Lymphedema   At Midpatella/Popliteal Crease  46.5 cm  (Pended)    was 47.1cm   30 cm Proximal to Floor at Lateral Plantar Foot  40 cm  (Pended)    was 44.9cm   20 cm Proximal to Floor at Lateral Plantar Foot  27.4 1  (Pended)    35.3cm   10 cm Proximal to Floor at Lateral Malleoli  22.6 cm  (Pended)    26.5cm   Circumference of ankle/heel  32.5 cm.  (Pended)    35.3cm   5 cm Proximal to 1st MTP Joint  23 cm  (Pended)    24.5cm   Across MTP Joint  24.2 cm  (Pended)    24.7cm   Around  Proximal Great Toe  8.7 cm  (Pended)    9.9cm     Left Lower Extremity Lymphedema   At Midpatella/Popliteal Crease  50 cm  (Pended)    52.3cm   30 cm Proximal to Floor at Lateral Plantar Foot  38 cm  (Pended)    46cm   20 cm Proximal to Floor at Lateral Plantar Foot  29.4 cm  (Pended)    37.2cm   10 cm Proximal to Floor at Lateral Malleoli  23.5 cm  (Pended)    27.2cm   Circumference of ankle/heel  33.5 cm.  (Pended)    36.2   5 cm Proximal to 1st MTP Joint  23 cm  (Pended)    24.5cm   Across MTP Joint  24.5 cm  (Pended)    24.8cm   Around Proximal Great Toe  8.8 cm  (Pended)    9cm               [facility] Adult PT Treatment/Exercise - 10/23/17 0001      Manual Therapy   Manual Therapy  Manual Lymphatic Drainage (MLD);Compression Bandaging;Other (comment)    Manual therapy comments  done seperate from all other aspects of treatment     Manual Lymphatic Drainage (MLD)  for bilateral LE's     Compression Bandaging  1/4" foam was cut for  compression bandaging with 1 6cm, 1 8 cm and 2 10 cm used on each LE .  LE were cleansed and moisturized prior to application of compression bandaging .     Other Manual Therapy  measurements               PT Short Term Goals - 10/21/17 1118      PT SHORT TERM GOAL #1   Title  Patient will bring in sock butler and compression stockings to problem solve with therapist on best way to don/soff garments to prepare for independence with managing lymphedema on discharge.    Time  3    Period  Weeks    Status  On-going        PT Long Term Goals - 10/21/17 1118      PT LONG TERM GOAL #1   Title  Patient will demonstrate compliance with compression pump 5x/week and wearing compression stockings for final week of therapy to prevent return of edema and blisters from to demonstrate improved independence with lymphedema management/self-care and readiness for discharge.    Time  6    Period  Weeks    Status  On-going            Plan - 10/23/17 1650    Clinical Impression Statement  Pt returns today wtih garments still in place.  Removed garments and measured wtih overall decompression in all areas with average of approx 2 cm.  Manual completed to anterior aspect only and instructed with use of compression pump.  Placed pump on Rt LE to demonstrate while working on Lt (clinics sleeve is too long for pateint).  Pt feels she may be able to use it but will have her donn/doff next session if unsuccessful.  Cleansed and mosturized well prior to redressing.  Able to remove approx 1 inch of foam from each anterior piece to increase compression area.  Pt reported overall comfort wtih bandages back in place.      Rehab Potential  Good    PT Frequency  2x / week    PT Duration  6 weeks    PT  Treatment/Interventions  ADLs/Self Care Home Management;Vasopneumatic Device;Manual lymph drainage;Compression bandaging;Patient/family education;Manual techniques    PT Next Visit Plan  measure LE  circumference 1x/week (thurs). Continue with total decongestive techniques.     Consulted and Agree with Plan of Care  Patient       Patient will benefit from skilled therapeutic intervention in order to improve the following deficits and impairments:  Increased edema, Decreased mobility, Decreased activity tolerance  Visit Diagnosis: Lymphedema, not elsewhere classified  Generalized edema     Problem List Patient Active Problem List   Diagnosis Date Noted  . Bilateral hip joint arthritis 09/06/2013  . Degeneration of intervertebral disc of lumbar region 09/06/2013  . BACK PAIN 01/24/2009  . CLOSED FRACTURE OF METATARSAL BONE 04/20/2007   Teena Irani, PTA/CLT [phone]  Teena Irani 10/23/2017, 4:55 PM  [place], [place], [address] Phone: [phone]   Fax:  [phone]  Name: [name] MRN: [id number] Date of Birth: 1931/04/07

## 2017-10-24 ENCOUNTER — Ambulatory Visit (HOSPITAL_COMMUNITY): Payer: Medicare Other | Admitting: Physical Therapy

## 2017-10-28 ENCOUNTER — Other Ambulatory Visit: Payer: Self-pay

## 2017-10-28 ENCOUNTER — Ambulatory Visit (HOSPITAL_COMMUNITY): Payer: Medicare Other | Admitting: Physical Therapy

## 2017-10-28 DIAGNOSIS — R601 Generalized edema: Secondary | ICD-10-CM

## 2017-10-28 DIAGNOSIS — I890 Lymphedema, not elsewhere classified: Secondary | ICD-10-CM | POA: Diagnosis not present

## 2017-10-28 NOTE — Therapy (Signed)
[place], [place], [address] Phone: [phone]   Fax:  [phone]  Physical Therapy Treatment  Patient Details  Name: [name] MRN: [id number] Date of Birth: 12-May-1931 Referring Provider: Remo Lipps case    Encounter Date: 10/28/2017   PHYSICAL THERAPY DISCHARGE SUMMARY  Visits from Start of Care: 4  Current functional level related to goals / functional outcomes: No reflux from LT LE>  Volumes of B LE are decreased.    Remaining deficits: PT has difficulty donning compression garments but is purchasing new butler    Education / Equipment: The need to use pump and wear compression garment.   Plan: Patient agrees to discharge.  Patient goals were partially met. Patient is being discharged due to meeting the stated rehab goals.  ?????      PT End of Session - 10/28/17 1147    Visit Number  4    Number of Visits  4    Date for PT Re-Evaluation  11/27/17    Authorization Type  Medicare PArt A and B, BCBS Supplement (no auth required, no visit limit)    Authorization Time Period  10/16/17 - 11/27/17    Authorization - Visit Number  4    Authorization - Number of Visits  4    PT Start Time  1040    PT Stop Time  1125    PT Time Calculation (min)  45 min    Activity Tolerance  Patient tolerated treatment well    Behavior During Therapy  WFL for tasks assessed/performed       Past Medical History:  Diagnosis Date  . Hypertension     Past Surgical History:  Procedure Laterality Date  . ABDOMINAL HYSTERECTOMY    . APPENDECTOMY    . BREAST BIOPSY    . CATARACT EXTRACTION W/PHACO Right 09/03/2012   Procedure: CATARACT EXTRACTION PHACO AND INTRAOCULAR LENS PLACEMENT (IOC);  Surgeon: Tonny Branch, MD;  Location: [facility];  Service: Ophthalmology;  Laterality: Right;  CDE: 12.37  . CATARACT EXTRACTION W/PHACO Left 09/28/2012   Procedure: CATARACT EXTRACTION PHACO AND INTRAOCULAR LENS PLACEMENT (IOC);   Surgeon: Tonny Branch, MD;  Location: [facility];  Service: Ophthalmology;  Laterality: Left;  CDE 18.52  . closed redution external fixation right wrist Right   . INSERTION OF MESH N/A 06/30/2015   Procedure: INSERTION OF MESH;  Surgeon: Aviva Signs, MD;  Location: [facility];  Service: General;  Laterality: N/A;  . JOINT REPLACEMENT     bilateral Hips at [facility]  . LUMBAR FUSION     Dr. Carloyn Manner  . TONSILLECTOMY    . UMBILICAL HERNIA REPAIR N/A 06/30/2015   Procedure: UMBILICAL HERNIORRHAPHY WITH MESH;  Surgeon: Aviva Signs, MD;  Location: [facility];  Service: General;  Laterality: N/A;    There were no vitals filed for this visit.  Subjective Assessment - 10/28/17 1143    Subjective  PT states that she took her bandages off last night.  Pt feels that her legs will not get any smaller and desires to go into her compression garment.  States she has an old butler that is not angled like the new ones and feel that the angle will assist her in donning her garments.      Pertinent History  lymphedema secondary to venous insufficiency    Limitations  Walking;House hold activities   cannont don/doff compression stockings   How long can you sit comfortably?  no problem  How long can you stand comfortably?  15 minutes    How long can you walk comfortably?  15 minutes    Patient Stated Goals  get legs smaller and to stop draining    Currently in Pain?  No/denies         [facility] PT Assessment - 10/28/17 0001      Assessment   Medical Diagnosis  Bil LE Lymphedema wiht Reflux secondary to venous insufficiency    Referring Provider  Remo Lipps case     Onset Date/Surgical Date  --   approximately 3 months ago   Prior Therapy  none      Precautions   Precautions  None      Restrictions   Weight Bearing Restrictions  No      Prior Function   Level of Independence  Independent with household mobility with device;Independent with community mobility with device   SPC   Leisure  has a 47 year old yorkie dog  who she plays with      Cognition   Overall Cognitive Status  Within Functional Limits for tasks assessed        LYMPHEDEMA/ONCOLOGY QUESTIONNAIRE - 10/28/17 1116      Lymphedema Assessments   Lymphedema Assessments  Lower extremities      Right Lower Extremity Lymphedema   At Midpatella/Popliteal Crease  46 cm   was 47.1   30 cm Proximal to Floor at Lateral Plantar Foot  41.7 cm   was 44.9   20 cm Proximal to Floor at Lateral Plantar Foot  29 1   was 35.3   10 cm Proximal to Floor at Lateral Malleoli  24 cm   was 26.5   Circumference of ankle/heel  31.8 cm.   was 35.3   5 cm Proximal to 1st MTP Joint  23.2 cm   was 24.5   Across MTP Joint  23.1 cm   was 24.7   Around Proximal Great Toe  8.7 cm   was 9.9     Left Lower Extremity Lymphedema   At Midpatella/Popliteal Crease  48.3 cm   was 52.3   30 cm Proximal to Floor at Lateral Plantar Foot  41 cm   was 46   20 cm Proximal to Floor at Lateral Plantar Foot  29.7 cm   was 37.2   10 cm Proximal to Floor at Lateral Malleoli  24.1 cm   was 27.2   Circumference of ankle/heel  32.7 cm.   was 36.2   5 cm Proximal to 1st MTP Joint  23 cm   was 24.5   Across MTP Joint  23.5 cm   was 24.8   Around Proximal Great Toe  8.8 cm   was 9               [facility] Adult PT Treatment/Exercise - 10/28/17 0001      Manual Therapy   Manual Therapy  Manual Lymphatic Drainage (MLD);Compression Bandaging;Other (comment)    Manual therapy comments  done seperate from all other aspects of treatment     Manual Lymphatic Drainage (MLD)  for bilateral LE's     Compression Bandaging  Pt sent to [facility] to be measured and fitted for knee high compression 20-30 mm HG.     Other Manual Therapy  measurements             PT Education - 10/28/17 1151    Education provided  Yes    Education Details  To  keep foam and short stretch bandages in a box and know where it is in case of an exacerbation.  The importance of using  her compression pump and garments on a daily basis.     Person(s) Educated  Patient    Methods  Explanation    Comprehension  Verbalized understanding       PT Short Term Goals - 10/28/17 1154      PT SHORT TERM GOAL #1   Title  Patient will bring in sock butler and compression stockings to problem solve with therapist on best way to don/soff garments to prepare for independence with managing lymphedema on discharge.    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        PT Long Term Goals - 10/28/17 1155      PT LONG TERM GOAL #1   Title  Patient will demonstrate compliance with compression pump 5x/week and wearing compression stockings for final week of therapy to prevent return of edema and blisters from to demonstrate improved independence with lymphedema management/self-care and readiness for discharge.    Time  6    Period  Weeks    Status  On-going   PT agrees to the need to wear garment and use pump.             Plan - 10/28/17 1148    Clinical Impression Statement  PT took her bandages off last night.  She states that her legs were dark initally but the color has come back.  PT has no more induration and should do well with daily wearing of knee high compression garment.  PT sent to be fitted for compression garment following manual will discharge pt if [facility] has Ms. Byers size in stock.  If they must order we will need to continue services until her garments arrive.     Rehab Potential  Good    PT Frequency  2x / week    PT Duration  6 weeks    PT Treatment/Interventions  ADLs/Self Care Home Management;Vasopneumatic Device;Manual lymph drainage;Compression bandaging;Patient/family education;Manual techniques    PT Next Visit Plan  Discharge to self care using compression garments on a daily basis.     Consulted and Agree with Plan of Care   Patient       Patient will benefit from skilled therapeutic intervention in order to improve the following deficits and impairments:  Increased edema, Decreased mobility, Decreased activity tolerance  Visit Diagnosis: Lymphedema, not elsewhere classified  Generalized edema     Problem List Patient Active Problem List   Diagnosis Date Noted  . Bilateral hip joint arthritis 09/06/2013  . Degeneration of intervertebral disc of lumbar region 09/06/2013  . BACK PAIN 01/24/2009  . CLOSED FRACTURE OF METATARSAL BONE 04/20/2007  Rayetta Humphrey, PT CLT [phone] 10/28/2017, 11:56 AM  [place] [address] [place], [place], [address] Phone: [phone]   Fax:  [phone]  Name: [name] MRN: [id number] Date of Birth: [birth year]

## 2017-10-29 ENCOUNTER — Telehealth (HOSPITAL_COMMUNITY): Payer: Self-pay | Admitting: Physical Therapy

## 2017-10-29 NOTE — Telephone Encounter (Signed)
Mailed garment order to patient. NF 10/29/17

## 2017-10-30 DIAGNOSIS — Z0001 Encounter for general adult medical examination with abnormal findings: Secondary | ICD-10-CM | POA: Diagnosis not present

## 2017-10-30 DIAGNOSIS — R600 Localized edema: Secondary | ICD-10-CM | POA: Diagnosis not present

## 2017-10-30 DIAGNOSIS — M1991 Primary osteoarthritis, unspecified site: Secondary | ICD-10-CM | POA: Diagnosis not present

## 2017-10-30 DIAGNOSIS — Z6836 Body mass index (BMI) 36.0-36.9, adult: Secondary | ICD-10-CM | POA: Diagnosis not present

## 2017-10-30 DIAGNOSIS — Z1389 Encounter for screening for other disorder: Secondary | ICD-10-CM | POA: Diagnosis not present

## 2017-10-30 DIAGNOSIS — E6609 Other obesity due to excess calories: Secondary | ICD-10-CM | POA: Diagnosis not present

## 2017-10-30 DIAGNOSIS — I10 Essential (primary) hypertension: Secondary | ICD-10-CM | POA: Diagnosis not present

## 2017-10-30 DIAGNOSIS — R7309 Other abnormal glucose: Secondary | ICD-10-CM | POA: Diagnosis not present

## 2017-10-30 DIAGNOSIS — J302 Other seasonal allergic rhinitis: Secondary | ICD-10-CM | POA: Diagnosis not present

## 2017-10-31 ENCOUNTER — Encounter (HOSPITAL_COMMUNITY): Payer: Medicare Other | Admitting: Physical Therapy

## 2017-11-04 ENCOUNTER — Encounter (HOSPITAL_COMMUNITY): Payer: Medicare Other | Admitting: Physical Therapy

## 2017-11-06 ENCOUNTER — Encounter (HOSPITAL_COMMUNITY): Payer: Medicare Other | Admitting: Physical Therapy

## 2017-11-10 ENCOUNTER — Encounter (HOSPITAL_COMMUNITY): Payer: Medicare Other | Admitting: Physical Therapy

## 2017-11-14 ENCOUNTER — Encounter (HOSPITAL_COMMUNITY): Payer: Medicare Other | Admitting: Physical Therapy

## 2017-11-17 ENCOUNTER — Encounter (HOSPITAL_COMMUNITY): Payer: Medicare Other | Admitting: Physical Therapy

## 2017-11-20 ENCOUNTER — Encounter (HOSPITAL_COMMUNITY): Payer: Medicare Other | Admitting: Physical Therapy

## 2017-11-24 ENCOUNTER — Encounter (HOSPITAL_COMMUNITY): Payer: Medicare Other | Admitting: Physical Therapy

## 2017-11-27 ENCOUNTER — Encounter (HOSPITAL_COMMUNITY): Payer: Medicare Other | Admitting: Physical Therapy

## 2017-12-01 ENCOUNTER — Encounter (HOSPITAL_COMMUNITY): Payer: Medicare Other | Admitting: Physical Therapy

## 2017-12-04 ENCOUNTER — Encounter (HOSPITAL_COMMUNITY): Payer: Medicare Other | Admitting: Physical Therapy

## 2017-12-09 DIAGNOSIS — Z23 Encounter for immunization: Secondary | ICD-10-CM | POA: Diagnosis not present

## 2017-12-29 DIAGNOSIS — L600 Ingrowing nail: Secondary | ICD-10-CM | POA: Diagnosis not present

## 2017-12-29 DIAGNOSIS — M79672 Pain in left foot: Secondary | ICD-10-CM | POA: Diagnosis not present

## 2017-12-29 DIAGNOSIS — M25579 Pain in unspecified ankle and joints of unspecified foot: Secondary | ICD-10-CM | POA: Diagnosis not present

## 2017-12-29 DIAGNOSIS — M79675 Pain in left toe(s): Secondary | ICD-10-CM | POA: Diagnosis not present

## 2018-01-21 DIAGNOSIS — M170 Bilateral primary osteoarthritis of knee: Secondary | ICD-10-CM | POA: Diagnosis not present

## 2018-05-06 DIAGNOSIS — Z96643 Presence of artificial hip joint, bilateral: Secondary | ICD-10-CM | POA: Diagnosis not present

## 2018-05-06 DIAGNOSIS — M170 Bilateral primary osteoarthritis of knee: Secondary | ICD-10-CM | POA: Diagnosis not present

## 2018-05-13 DIAGNOSIS — M719 Bursopathy, unspecified: Secondary | ICD-10-CM | POA: Diagnosis not present

## 2018-05-13 DIAGNOSIS — M25512 Pain in left shoulder: Secondary | ICD-10-CM | POA: Diagnosis not present

## 2018-05-13 DIAGNOSIS — M67919 Unspecified disorder of synovium and tendon, unspecified shoulder: Secondary | ICD-10-CM | POA: Diagnosis not present

## 2018-08-07 DIAGNOSIS — E669 Obesity, unspecified: Secondary | ICD-10-CM | POA: Diagnosis not present

## 2018-08-07 DIAGNOSIS — R600 Localized edema: Secondary | ICD-10-CM | POA: Diagnosis not present

## 2018-08-07 DIAGNOSIS — W64XXXA Exposure to other animate mechanical forces, initial encounter: Secondary | ICD-10-CM | POA: Diagnosis not present

## 2018-08-07 DIAGNOSIS — I10 Essential (primary) hypertension: Secondary | ICD-10-CM | POA: Diagnosis not present

## 2018-08-07 DIAGNOSIS — Z6837 Body mass index (BMI) 37.0-37.9, adult: Secondary | ICD-10-CM | POA: Diagnosis not present

## 2018-08-07 DIAGNOSIS — M1991 Primary osteoarthritis, unspecified site: Secondary | ICD-10-CM | POA: Diagnosis not present

## 2018-08-07 DIAGNOSIS — L139 Bullous disorder, unspecified: Secondary | ICD-10-CM | POA: Diagnosis not present

## 2018-08-07 DIAGNOSIS — I87392 Chronic venous hypertension (idiopathic) with other complications of left lower extremity: Secondary | ICD-10-CM | POA: Diagnosis not present

## 2018-08-07 DIAGNOSIS — Z1389 Encounter for screening for other disorder: Secondary | ICD-10-CM | POA: Diagnosis not present

## 2018-09-01 ENCOUNTER — Ambulatory Visit (HOSPITAL_COMMUNITY): Payer: Medicare Other | Attending: Internal Medicine | Admitting: Rehabilitation

## 2018-09-01 ENCOUNTER — Other Ambulatory Visit: Payer: Self-pay

## 2018-09-01 ENCOUNTER — Encounter (HOSPITAL_COMMUNITY): Payer: Self-pay | Admitting: Rehabilitation

## 2018-09-01 DIAGNOSIS — I890 Lymphedema, not elsewhere classified: Secondary | ICD-10-CM | POA: Insufficient documentation

## 2018-09-01 NOTE — Therapy (Signed)
[place], [place], [address] Phone: [phone]   Fax:  [phone]  Physical Therapy Evaluation  Patient Details  Name: [name] MRN: [id number] Date of Birth: 09-07-1931 Referring Provider (PT): Dr. Gerarda Fraction   Encounter Date: 09/01/2018  PT End of Session - 09/01/18 1054    Visit Number  1    Number of Visits  10    Date for PT Re-Evaluation  09/28/18    Authorization Type  Medicare    PT Start Time  0902    PT Stop Time  0950    PT Time Calculation (min)  48 min    Activity Tolerance  Patient tolerated treatment well    Behavior During Therapy  [facility] for tasks assessed/performed       Past Medical History:  Diagnosis Date  . Hypertension     Past Surgical History:  Procedure Laterality Date  . ABDOMINAL HYSTERECTOMY    . APPENDECTOMY    . BREAST BIOPSY    . CATARACT EXTRACTION W/PHACO Right 09/03/2012   Procedure: CATARACT EXTRACTION PHACO AND INTRAOCULAR LENS PLACEMENT (IOC);  Surgeon: Tonny Branch, MD;  Location: [facility];  Service: Ophthalmology;  Laterality: Right;  CDE: 12.37  . CATARACT EXTRACTION W/PHACO Left 09/28/2012   Procedure: CATARACT EXTRACTION PHACO AND INTRAOCULAR LENS PLACEMENT (IOC);  Surgeon: Tonny Branch, MD;  Location: [facility];  Service: Ophthalmology;  Laterality: Left;  CDE 18.52  . closed redution external fixation right wrist Right   . INSERTION OF MESH N/A 06/30/2015   Procedure: INSERTION OF MESH;  Surgeon: Aviva Signs, MD;  Location: [facility];  Service: General;  Laterality: N/A;  . JOINT REPLACEMENT     bilateral Hips at [facility]  . LUMBAR FUSION     Dr. Carloyn Manner  . TONSILLECTOMY    . UMBILICAL HERNIA REPAIR N/A 06/30/2015   Procedure: UMBILICAL HERNIORRHAPHY WITH MESH;  Surgeon: Aviva Signs, MD;  Location: [facility];  Service: General;  Laterality: N/A;    There were no vitals filed for this visit.   Subjective Assessment - 09/01/18 0907    Subjective  The got better but they  started blistering again.  I still have the pump but don't use it.  I also have stockings but I can't get them on.  I even have a butler and can't use it.    Pertinent History  previous treatment here.  HTN, obesity, OA, lumbar surgery, B THR    Patient Stated Goals  get my legs wrapped again    Currently in Pain?  No/denies   tingling only in the legs        [facility] PT Assessment - 09/01/18 0001      Assessment   Medical Diagnosis  CVI with lymphedema    Referring Provider (PT)  Dr. Gerarda Fraction    Onset Date/Surgical Date  05/03/18    Hand Dominance  Right    Prior Therapy  yes      Precautions   Precaution Comments  lymphedema LE, falls      Restrictions   Weight Bearing Restrictions  No      Balance Screen   Has the patient fallen in the past 6 months  No    Has the patient had a decrease in activity level because of a fear of falling?   No    Is the patient reluctant to leave their home because of a fear of falling?   No  [place]  Private residence    Living Arrangements  Alone    Available Help at Discharge  Neighbor    Type of [place]    Additional Comments  I go up and down stairs      Prior Function   Level of Independence  Independent with household mobility with device    Leisure  walking to the mailbox      Cognition   Overall Cognitive Status  Within Functional Limits for tasks assessed      Observation/Other Assessments   Observations  CVI with hemosiderin staining with many small blisters bilateral LEs      ROM / Strength   AROM / PROM / Strength  AROM      AROM   Overall AROM Comments  WNL seated      Palpation   Palpation comment  +2-3 pitting edema  lower legs  up to the knee bil      Ambulation/Gait   Gait Comments  ambulates with SPC            LYMPHEDEMA/ONCOLOGY QUESTIONNAIRE - 09/01/18 3267      What other symptoms do you have   Are you Having Heaviness or Tightness  No    Are you having Pain  No     Is it Hard or Difficult finding clothes that fit  Yes      Lymphedema Assessments   Lymphedema Assessments  Lower extremities      Right Lower Extremity Lymphedema   At Midpatella/Popliteal Crease  53 cm   seated   30 cm Proximal to Floor at Lateral Plantar Foot  49.4 cm    20 cm Proximal to Floor at Lateral Plantar Foot  37.2 1    10  cm Proximal to Floor at Lateral Malleoli  36.7 cm    Circumference of ankle/heel  36.5 cm.    5 cm Proximal to 1st MTP Joint  24.3 cm    Across MTP Joint  24.5 cm    Around Proximal Great Toe  8.8 cm      Left Lower Extremity Lymphedema   At Midpatella/Popliteal Crease  52 cm    30 cm Proximal to Floor at Lateral Plantar Foot  48 cm    20 cm Proximal to Floor at Lateral Plantar Foot  38.4 cm    10 cm Proximal to Floor at Lateral Malleoli  28 cm    Circumference of ankle/heel  36.3 cm.    5 cm Proximal to 1st MTP Joint  24.7 cm    Across MTP Joint  25 cm    Around Proximal Great Toe  8.7 cm             Objective measurements completed on examination: See above findings.      [facility] Adult PT Treatment/Exercise - 09/01/18 0001      Manual Therapy   Manual Therapy  Compression Bandaging    Compression Bandaging  profore applied by Roseanne Reno PTA to bilateral LEs due to potential for opening blisters.               PT Education - 09/01/18 1053    Education Details  POC    Person(s) Educated  Patient    Methods  Explanation    Comprehension  Verbalized understanding          PT Long Term Goals - 09/01/18 1210      PT LONG TERM GOAL #1  Title  Patient will demonstrate compliance with compression pump 5x/week and wearing compression stockings for final week of therapy to prevent return of edema and blisters from to demonstrate improved independence with lymphedema management/self-care and readiness for discharge.    Time  3    Period  Weeks    Status  New             Plan - 09/01/18 1055    Clinical Impression  Statement  Pt known to the clinic here returns reporting no compliance with self care and return of swelling and blisters.  Pt currently has a pump, stockings, and a butler that she does not use at home.  Educated again on importance of these for successful treatment.  PTA wrapped with profore bilateral LEs due to potential for blister occurrence as compression wrapping will not be done until Monday of next week.    Personal Factors and Comorbidities  Age;Fitness;Behavior Pattern;Past/Current Experience    Examination-Activity Limitations  Stairs;Dressing;Squat;Locomotion Level    Examination-Participation Restrictions  Yard Work;Cleaning;Meal Prep;Community Activity    Stability/Clinical Decision Making  Evolving/Moderate complexity   worsening swelling   Clinical Decision Making  Low    Rehab Potential  Fair    PT Frequency  3x / week    PT Duration  3 weeks    PT Treatment/Interventions  ADLs/Self Care Home Management;Manual techniques;Manual lymph drainage;Compression bandaging;Gait training;DME Instruction;Therapeutic exercise    PT Next Visit Plan  begin CDT bilateral LEs to the knee.  Work towards independence somehow with self care    Consulted and Agree with Plan of Care  Patient       Patient will benefit from skilled therapeutic intervention in order to improve the following deficits and impairments:  Abnormal gait, Decreased skin integrity, Increased edema, Difficulty walking  Visit Diagnosis: 1. Lymphedema, not elsewhere classified        Problem List Patient Active Problem List   Diagnosis Date Noted  . Bilateral hip joint arthritis 09/06/2013  . Degeneration of intervertebral disc of lumbar region 09/06/2013  . BACK PAIN 01/24/2009  . CLOSED FRACTURE OF METATARSAL BONE 04/20/2007    Stark Bray, PT 09/01/2018, 12:12 PM  [place], [place], [address] Phone: [phone]   Fax:  [phone]   Name: [name] MRN: [id number] Date of Birth: [birth year]

## 2018-09-07 ENCOUNTER — Encounter (HOSPITAL_COMMUNITY): Payer: Self-pay | Admitting: Physical Therapy

## 2018-09-07 ENCOUNTER — Other Ambulatory Visit: Payer: Self-pay

## 2018-09-07 ENCOUNTER — Ambulatory Visit (HOSPITAL_COMMUNITY): Payer: Medicare Other | Attending: Internal Medicine | Admitting: Physical Therapy

## 2018-09-07 DIAGNOSIS — R601 Generalized edema: Secondary | ICD-10-CM | POA: Insufficient documentation

## 2018-09-07 DIAGNOSIS — I890 Lymphedema, not elsewhere classified: Secondary | ICD-10-CM | POA: Diagnosis not present

## 2018-09-07 NOTE — Therapy (Signed)
[place], [place], [address] Phone: [phone]   Fax:  [phone]  Physical Therapy Treatment  Patient Details  Name: [name] MRN: [id number] Date of Birth: 09/19/1931 Referring Provider (PT): Dr. Gerarda Fraction   Encounter Date: 09/07/2018  PT End of Session - 09/07/18 1832    Visit Number  2    Number of Visits  10    Date for PT Re-Evaluation  09/28/18    Authorization Type  Medicare    Activity Tolerance  Patient tolerated treatment well    Behavior During Therapy  [facility] for tasks assessed/performed       Past Medical History:  Diagnosis Date  . Hypertension     Past Surgical History:  Procedure Laterality Date  . ABDOMINAL HYSTERECTOMY    . APPENDECTOMY    . BREAST BIOPSY    . CATARACT EXTRACTION W/PHACO Right 09/03/2012   Procedure: CATARACT EXTRACTION PHACO AND INTRAOCULAR LENS PLACEMENT (IOC);  Surgeon: Tonny Branch, MD;  Location: [facility];  Service: Ophthalmology;  Laterality: Right;  CDE: 12.37  . CATARACT EXTRACTION W/PHACO Left 09/28/2012   Procedure: CATARACT EXTRACTION PHACO AND INTRAOCULAR LENS PLACEMENT (IOC);  Surgeon: Tonny Branch, MD;  Location: [facility];  Service: Ophthalmology;  Laterality: Left;  CDE 18.52  . closed redution external fixation right wrist Right   . INSERTION OF MESH N/A 06/30/2015   Procedure: INSERTION OF MESH;  Surgeon: Aviva Signs, MD;  Location: [facility];  Service: General;  Laterality: N/A;  . JOINT REPLACEMENT     bilateral Hips at [facility]  . LUMBAR FUSION     Dr. Carloyn Manner  . TONSILLECTOMY    . UMBILICAL HERNIA REPAIR N/A 06/30/2015   Procedure: UMBILICAL HERNIORRHAPHY WITH MESH;  Surgeon: Aviva Signs, MD;  Location: [facility];  Service: General;  Laterality: N/A;    There were no vitals filed for this visit.  Subjective Assessment - 09/07/18 1830    Subjective  pt states her legs are already better.                       [facility] Adult PT Treatment/Exercise  - 09/07/18 0001      Manual Therapy   Manual Therapy  Compression Bandaging    Manual therapy comments  done seperate from all other aspects of treatment     Compression Bandaging  cleansed, moisturized, 1/4" foam, multilayer short stretch and cotton padding used this session.              PT Education - 09/07/18 1832    Education Details  goal review and POC moving forward. Discussed compression hose options, need to replace every 6 months.    Person(s) Educated  Patient    Methods  Explanation    Comprehension  Verbalized understanding          PT Long Term Goals - 09/01/18 1210      PT LONG TERM GOAL #1   Title  Patient will demonstrate compliance with compression pump 5x/week and wearing compression stockings for final week of therapy to prevent return of edema and blisters from to demonstrate improved independence with lymphedema management/self-care and readiness for discharge.    Time  3    Period  Weeks    Status  New            Plan - 09/07/18 1833    Clinical Impression Statement  REmoved profore to discover drastic reduction in edema  in distal LE's.  ONly one small blister remains Rt medial anterior LE.  Cleansed well and moisturized prior to rebandaging using multi layered short stretch and 1/4" foam padding.  Pt reported overall comfort.  Anticipate complete healing and reduction within the next 2 weeks.    Personal Factors and Comorbidities  Age;Fitness;Behavior Pattern;Past/Current Experience    Examination-Activity Limitations  Stairs;Dressing;Squat;Locomotion Level    Examination-Participation Restrictions  Yard Work;Cleaning;Meal Prep;Community Activity    Stability/Clinical Decision Making  Evolving/Moderate complexity   worsening swelling   Rehab Potential  Fair    PT Frequency  3x / week    PT Duration  3 weeks    PT Treatment/Interventions  ADLs/Self Care Home Management;Manual techniques;Manual lymph drainage;Compression bandaging;Gait  training;DME Instruction;Therapeutic exercise    PT Next Visit Plan  continue with lymphedema therapy. Pt to bring in her new knee highs and donning device to work with when ready.    Consulted and Agree with Plan of Care  Patient       Patient will benefit from skilled therapeutic intervention in order to improve the following deficits and impairments:  Abnormal gait, Decreased skin integrity, Increased edema, Difficulty walking  Visit Diagnosis: 1. Generalized edema   2. Lymphedema, not elsewhere classified        Problem List Patient Active Problem List   Diagnosis Date Noted  . Bilateral hip joint arthritis 09/06/2013  . Degeneration of intervertebral disc of lumbar region 09/06/2013  . BACK PAIN 01/24/2009  . CLOSED FRACTURE OF METATARSAL BONE 04/20/2007   Teena Irani, PTA/CLT [phone]  Teena Irani 09/07/2018, 6:35 PM  [place], [place], [address] Phone: [phone]   Fax:  [phone]  Name: [name] MRN: [id number] Date of Birth: 11-27-1931

## 2018-09-09 ENCOUNTER — Ambulatory Visit (HOSPITAL_COMMUNITY): Payer: Medicare Other | Admitting: Physical Therapy

## 2018-09-09 ENCOUNTER — Other Ambulatory Visit: Payer: Self-pay

## 2018-09-09 DIAGNOSIS — I890 Lymphedema, not elsewhere classified: Secondary | ICD-10-CM | POA: Diagnosis not present

## 2018-09-09 DIAGNOSIS — R601 Generalized edema: Secondary | ICD-10-CM

## 2018-09-09 NOTE — Therapy (Signed)
[place], [place], [address] Phone: [phone]   Fax:  [phone]  Physical Therapy Treatment  Patient Details  Name: [name] MRN: [id number] Date of Birth: 1931/09/23 Referring Provider (PT): Dr. Gerarda Fraction   Encounter Date: 09/09/2018  PT End of Session - 09/09/18 1646    Visit Number  3    Number of Visits  10    Date for PT Re-Evaluation  09/28/18    Authorization Type  Medicare    PT Start Time  1445    PT Stop Time  1600    PT Time Calculation (min)  75 min    Activity Tolerance  Patient tolerated treatment well    Behavior During Therapy  [facility] for tasks assessed/performed       Past Medical History:  Diagnosis Date  . Hypertension     Past Surgical History:  Procedure Laterality Date  . ABDOMINAL HYSTERECTOMY    . APPENDECTOMY    . BREAST BIOPSY    . CATARACT EXTRACTION W/PHACO Right 09/03/2012   Procedure: CATARACT EXTRACTION PHACO AND INTRAOCULAR LENS PLACEMENT (IOC);  Surgeon: Tonny Branch, MD;  Location: [facility];  Service: Ophthalmology;  Laterality: Right;  CDE: 12.37  . CATARACT EXTRACTION W/PHACO Left 09/28/2012   Procedure: CATARACT EXTRACTION PHACO AND INTRAOCULAR LENS PLACEMENT (IOC);  Surgeon: Tonny Branch, MD;  Location: [facility];  Service: Ophthalmology;  Laterality: Left;  CDE 18.52  . closed redution external fixation right wrist Right   . INSERTION OF MESH N/A 06/30/2015   Procedure: INSERTION OF MESH;  Surgeon: Aviva Signs, MD;  Location: [facility];  Service: General;  Laterality: N/A;  . JOINT REPLACEMENT     bilateral Hips at [facility]  . LUMBAR FUSION     Dr. Carloyn Manner  . TONSILLECTOMY    . UMBILICAL HERNIA REPAIR N/A 06/30/2015   Procedure: UMBILICAL HERNIORRHAPHY WITH MESH;  Surgeon: Aviva Signs, MD;  Location: [facility];  Service: General;  Laterality: N/A;    There were no vitals filed for this visit.  Subjective Assessment - 09/09/18 1628    Subjective  pt states she is doing well  today, her legs feel much better.            LYMPHEDEMA/ONCOLOGY QUESTIONNAIRE - 09/09/18 1630      Right Lower Extremity Lymphedema   At Midpatella/Popliteal Crease  48.3 cm   was 53 cm on 09/01/18   30 cm Proximal to Floor at Lateral Plantar Foot  48 cm   was 49.4 cm   20 cm Proximal to Floor at Lateral Plantar Foot  29.5 1   was 37.2 cm   10 cm Proximal to Floor at Lateral Malleoli  23.3 cm   was 36.7 cm   Circumference of ankle/heel  32.5 cm.   was 36.5 cm   5 cm Proximal to 1st MTP Joint  23.2 cm   was 24.3 cm   Across MTP Joint  23 cm   was 24.5 cm   Around Proximal Great Toe  8.2 cm   was 8.8 cm     Left Lower Extremity Lymphedema   At Midpatella/Popliteal Crease  48 cm   was 52 cm on 09/01/18   30 cm Proximal to Floor at Lateral Plantar Foot  40 cm   was 48 cm   20 cm Proximal to Floor at Lateral Plantar Foot  31 cm   was 38.4 cm   10 cm Proximal to Floor  at Lateral Malleoli  24 cm   was 28 cm   Circumference of ankle/heel  33.5 cm.   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  Unable to complete without additional assistance, which she has available.    Person(s) Educated  Patient;Caregiver(s)    Methods  Explanation;Demonstration;Tactile cues;Verbal cues    Comprehension  Verbal cues required;Tactile cues required;Verbalized understanding          PT Long Term Goals - 09/01/18 1210      PT LONG TERM GOAL #1   Title  Patient will demonstrate compliance with compression pump 5x/week and wearing compression stockings for final week of therapy to prevent return of edema and blisters from to demonstrate improved independence with lymphedema management/self-care and readiness for discharge.    Time  3    Period  Weeks     Status  New            Plan - 09/09/18 1647    Clinical Impression Statement  Continued reduction noted and no signs of skin breakdown or blistering.  Measured with measures close to those at discharge on 10/28/17.  Pt brought her compression socks with her.  These socks are only 31mmHg.  States these are the only ones she can wear as she discovered last visit, the 20-30 are too much.  Explained to pateint this is probably why the fluid is coming back, because she needs more compression.  Shown the circaids (velcro) and explained she would probably have to go to this is welling returned this time.  Wored with several differnet donning aides and she is unable to get them on without assitance, which she doe shave at home.  Reminded patient to put these on each morning, remove at night.  Only moisturize at night and replace every 6 months.  Pt verbalized understanding.  To keep next appointment and see how garments do today and tomorrow before cancelling/discharging.    Personal Factors and Comorbidities  Age;Fitness;Behavior Pattern;Past/Current Experience    Examination-Activity Limitations  Stairs;Dressing;Squat;Locomotion Level    Examination-Participation Restrictions  Yard Work;Cleaning;Meal Prep;Community Activity    Stability/Clinical Decision Making  Evolving/Moderate complexity   worsening swelling   Rehab Potential  Fair    PT Frequency  3x / week    PT Duration  3 weeks    PT Treatment/Interventions  ADLs/Self Care Home Management;Manual techniques;Manual lymph drainage;Compression bandaging;Gait training;DME Instruction;Therapeutic exercise    PT Next Visit Plan  Pt to be discharged as long as edema does not return and she has no further issues.    Consulted and Agree with Plan of Care  Patient       Patient will benefit from skilled therapeutic intervention in order to improve the following deficits and impairments:  Abnormal gait, Decreased skin integrity, Increased edema,  Difficulty walking  Visit Diagnosis: 1. Lymphedema, not elsewhere classified   2. Generalized edema        Problem List Patient Active Problem List   Diagnosis Date Noted  . Bilateral hip joint arthritis 09/06/2013  . Degeneration  of intervertebral disc of lumbar region 09/06/2013  . BACK PAIN 01/24/2009  . CLOSED FRACTURE OF METATARSAL BONE 04/20/2007   Teena Irani, PTA/CLT [phone]  Teena Irani 09/09/2018, 5:06 PM  [place], [place], [address] Phone: [phone]   Fax:  [phone]  Name: CINNAMON MORENCY MRN: [id number] Date of Birth: [birth year]

## 2018-09-11 ENCOUNTER — Ambulatory Visit (HOSPITAL_COMMUNITY): Payer: Medicare Other | Admitting: Physical Therapy

## 2018-09-11 ENCOUNTER — Other Ambulatory Visit: Payer: Self-pay

## 2018-09-11 DIAGNOSIS — R601 Generalized edema: Secondary | ICD-10-CM | POA: Diagnosis not present

## 2018-09-11 DIAGNOSIS — I890 Lymphedema, not elsewhere classified: Secondary | ICD-10-CM | POA: Diagnosis not present

## 2018-09-11 NOTE — Therapy (Addendum)
[place], [place], [address] Phone: [phone]   Fax:  [phone]  Physical Therapy Treatment & Discharge Summary  Patient Details  Name: [name] MRN: [id number] Date of Birth: [birth year] Referring Provider (PT): Dr. Gerarda Fraction   Encounter Date: 09/11/2018  PHYSICAL THERAPY DISCHARGE SUMMARY  Visits from Start of Care: 4  Current functional level related to goals / functional outcomes: Patient was seen by Roseanne Reno, PTA, CLT for her final session. She has had successful reduction in LE limb circumference however with slight increase in limb size since last session. Patient has been provided education on proper use of compression pump and daily wear of compression stockings to manage edema. She will be discharged from physical therapy at this time as she no longer requires skilled lymphedema services.     Remaining deficits: See below details   Education / Equipment: Educated on how increased compression will contain her edema better, educated on where to purchase and given information.   Plan: Patient agrees to discharge.  Patient goals were met. Patient is being discharged due to meeting the stated rehab goals.  ?????         Kipp Brood, PT, DPT, [facility] Physical Therapist with [facility]    PT End of Session - 09/11/18 1613    Visit Number  4    Number of Visits  10    Date for PT Re-Evaluation  09/28/18    Authorization Type  Medicare    PT Start Time  1500    PT Stop Time  1530    PT Time Calculation (min)  30 min    Activity Tolerance  Patient tolerated treatment well    Behavior During Therapy  [facility] for tasks assessed/performed       Past Medical History:  Diagnosis Date  . Hypertension     Past Surgical History:  Procedure Laterality Date  . ABDOMINAL HYSTERECTOMY    . APPENDECTOMY    . BREAST BIOPSY    . CATARACT EXTRACTION W/PHACO Right 09/03/2012    Procedure: CATARACT EXTRACTION PHACO AND INTRAOCULAR LENS PLACEMENT (IOC);  Surgeon: Tonny Branch, MD;  Location: [facility];  Service: Ophthalmology;  Laterality: Right;  CDE: 12.37  . CATARACT EXTRACTION W/PHACO Left 09/28/2012   Procedure: CATARACT EXTRACTION PHACO AND INTRAOCULAR LENS PLACEMENT (IOC);  Surgeon: Tonny Branch, MD;  Location: [facility];  Service: Ophthalmology;  Laterality: Left;  CDE 18.52  . closed redution external fixation right wrist Right   . INSERTION OF MESH N/A 06/30/2015   Procedure: INSERTION OF MESH;  Surgeon: Aviva Signs, MD;  Location: [facility];  Service: General;  Laterality: N/A;  . JOINT REPLACEMENT     bilateral Hips at [facility]  . LUMBAR FUSION     Dr. Carloyn Manner  . TONSILLECTOMY    . UMBILICAL HERNIA REPAIR N/A 06/30/2015   Procedure: UMBILICAL HERNIORRHAPHY WITH MESH;  Surgeon: Aviva Signs, MD;  Location: [facility];  Service: General;  Laterality: N/A;    There were no vitals filed for this visit.  Subjective Assessment - 09/11/18 1610    Subjective  PT states her legs still feel good but can tell they're a little more swollen than when she left last visit.            LYMPHEDEMA/ONCOLOGY QUESTIONNAIRE - 09/11/18 1611      Right Lower Extremity Lymphedema   30 cm Proximal to Floor at Lateral Plantar Foot  42.8  cm   was 48 last visit   20 cm Proximal to Floor at Lateral Plantar Foot  34 1   was 29.5 last visit   10 cm Proximal to Floor at Lateral Malleoli  26.5 cm   was 23.3 last visit     Left Lower Extremity Lymphedema   30 cm Proximal to Floor at Lateral Plantar Foot  43 cm   was 40 last visit   20 cm Proximal to Floor at Lateral Plantar Foot  36.5 cm   was 31   10 cm Proximal to Floor at Lateral Malleoli  26.8 cm   was 24                       PT Education - 09/11/18 1611    Education Details  Educated on how increased compression will contain her edema better, educated on where to purchase and given information.    Person(s)  Educated  Patient    Methods  Explanation    Comprehension  Verbalized understanding          PT Long Term Goals - 09/01/18 1210      PT LONG TERM GOAL #1   Title  Patient will demonstrate compliance with compression pump 5x/week and wearing compression stockings for final week of therapy to prevent return of edema and blisters from to demonstrate improved independence with lymphedema management/self-care and readiness for discharge.    Time  3    Period  Weeks    Status  Met           Plan - 09/11/18 1614    Clinical Impression Statement  Urged patient to order higher compression as 21mHG is not enough to contain. Remeasured at 10cm, 20cm and 30cm from floor with average of 3 cm increase from last session.   Pt verbalized understanding and given information to order garemtns.  Also suggested she get stockings with silicone top to keep from rolling down.  pt is to remain on schedule and cancel appointment if she feels her compression is adequate.    Personal Factors and Comorbidities  Age;Fitness;Behavior Pattern;Past/Current Experience    Examination-Activity Limitations  Stairs;Dressing;Squat;Locomotion Level    Examination-Participation Restrictions  Yard Work;Cleaning;Meal Prep;Community Activity    Stability/Clinical Decision Making  Evolving/Moderate complexity   worsening swelling   Rehab Potential  Fair    PT Frequency  3x / week    PT Duration  3 weeks    PT Treatment/Interventions  ADLs/Self Care Home Management;Manual techniques;Manual lymph drainage;Compression bandaging;Gait training;DME Instruction;Therapeutic exercise    PT Next Visit Plan  Pt to be discharged as long as edema does not return and she has no further issues.    Consulted and Agree with Plan of Care  Patient       Patient will benefit from skilled therapeutic intervention in order to improve the following deficits and impairments:  Abnormal gait, Decreased skin integrity, Increased edema,  Difficulty walking  Visit Diagnosis: 1. Lymphedema, not elsewhere classified   2. Generalized edema        Problem List Patient Active Problem List   Diagnosis Date Noted  . Bilateral hip joint arthritis 09/06/2013  . Degeneration of intervertebral disc of lumbar region 09/06/2013  . BACK PAIN 01/24/2009  . CLOSED FRACTURE OF METATARSAL BONE 04/20/2007   ATeena Irani PTA/CLT [phone] RenoB 09/11/2018, 4:16 PM  [place] [address]Phone:  [phone]   Fax:  [phone]  Name: [name] MRN: [id number] Date of Birth: 1931/12/31

## 2018-09-14 ENCOUNTER — Telehealth (HOSPITAL_COMMUNITY): Payer: Self-pay | Admitting: Physical Therapy

## 2018-09-14 ENCOUNTER — Ambulatory Visit (HOSPITAL_COMMUNITY): Payer: Medicare Other | Admitting: Physical Therapy

## 2018-09-14 NOTE — Telephone Encounter (Signed)
Pt called regarding continuing therapy.  Pt states she is pleased with her LE and is wearing her garments everyday.  STates she has not had a chance to order higher mmHg stockings due to her sick dog but she is going to.  Pt requested to be discharged at this point.  Reminded patient to continue wearing her stockings everyday and order new ones in 6 months.  PT verbalized understanding.   Teena Irani, PTA/CLT [phone]

## 2018-09-16 ENCOUNTER — Ambulatory Visit (HOSPITAL_COMMUNITY): Payer: Medicare Other | Admitting: Physical Therapy

## 2018-09-18 ENCOUNTER — Ambulatory Visit (HOSPITAL_COMMUNITY): Payer: Medicare Other | Admitting: Physical Therapy

## 2018-09-21 ENCOUNTER — Ambulatory Visit (HOSPITAL_COMMUNITY): Payer: Medicare Other | Admitting: Physical Therapy

## 2018-09-23 ENCOUNTER — Encounter (HOSPITAL_COMMUNITY): Payer: Medicare Other | Admitting: Physical Therapy

## 2018-09-28 ENCOUNTER — Encounter (HOSPITAL_COMMUNITY): Payer: Medicare Other | Admitting: Physical Therapy

## 2018-09-30 ENCOUNTER — Encounter (HOSPITAL_COMMUNITY): Payer: Medicare Other | Admitting: Physical Therapy

## 2018-09-30 DIAGNOSIS — M170 Bilateral primary osteoarthritis of knee: Secondary | ICD-10-CM | POA: Diagnosis not present

## 2018-10-02 ENCOUNTER — Encounter (HOSPITAL_COMMUNITY): Payer: Medicare Other | Admitting: Physical Therapy

## 2018-10-05 ENCOUNTER — Encounter (HOSPITAL_COMMUNITY): Payer: Medicare Other | Admitting: Physical Therapy

## 2018-10-07 ENCOUNTER — Encounter (HOSPITAL_COMMUNITY): Payer: Medicare Other | Admitting: Physical Therapy

## 2018-10-09 ENCOUNTER — Encounter (HOSPITAL_COMMUNITY): Payer: Medicare Other | Admitting: Physical Therapy

## 2018-10-13 ENCOUNTER — Encounter (HOSPITAL_COMMUNITY): Payer: Medicare Other | Admitting: Physical Therapy

## 2018-10-15 ENCOUNTER — Encounter (HOSPITAL_COMMUNITY): Payer: Medicare Other | Admitting: Physical Therapy

## 2018-10-19 ENCOUNTER — Encounter (HOSPITAL_COMMUNITY): Payer: Medicare Other | Admitting: Physical Therapy

## 2018-10-21 ENCOUNTER — Encounter (HOSPITAL_COMMUNITY): Payer: Medicare Other | Admitting: Physical Therapy

## 2018-10-23 ENCOUNTER — Encounter (HOSPITAL_COMMUNITY): Payer: Medicare Other | Admitting: Physical Therapy

## 2018-10-26 ENCOUNTER — Encounter (HOSPITAL_COMMUNITY): Payer: Medicare Other | Admitting: Physical Therapy

## 2018-10-28 ENCOUNTER — Encounter (HOSPITAL_COMMUNITY): Payer: Medicare Other | Admitting: Physical Therapy

## 2018-10-30 ENCOUNTER — Encounter (HOSPITAL_COMMUNITY): Payer: Medicare Other | Admitting: Physical Therapy

## 2018-11-02 ENCOUNTER — Encounter (HOSPITAL_COMMUNITY): Payer: Medicare Other | Admitting: Physical Therapy

## 2018-11-20 DIAGNOSIS — E6609 Other obesity due to excess calories: Secondary | ICD-10-CM | POA: Diagnosis not present

## 2018-11-20 DIAGNOSIS — R7309 Other abnormal glucose: Secondary | ICD-10-CM | POA: Diagnosis not present

## 2018-11-20 DIAGNOSIS — Z1389 Encounter for screening for other disorder: Secondary | ICD-10-CM | POA: Diagnosis not present

## 2018-11-20 DIAGNOSIS — E559 Vitamin D deficiency, unspecified: Secondary | ICD-10-CM | POA: Diagnosis not present

## 2018-11-20 DIAGNOSIS — Z0000 Encounter for general adult medical examination without abnormal findings: Secondary | ICD-10-CM | POA: Diagnosis not present

## 2018-11-20 DIAGNOSIS — E039 Hypothyroidism, unspecified: Secondary | ICD-10-CM | POA: Diagnosis not present

## 2018-11-20 DIAGNOSIS — M1991 Primary osteoarthritis, unspecified site: Secondary | ICD-10-CM | POA: Diagnosis not present

## 2018-11-20 DIAGNOSIS — M810 Age-related osteoporosis without current pathological fracture: Secondary | ICD-10-CM | POA: Diagnosis not present

## 2018-11-20 DIAGNOSIS — E7849 Other hyperlipidemia: Secondary | ICD-10-CM | POA: Diagnosis not present

## 2018-11-20 DIAGNOSIS — E119 Type 2 diabetes mellitus without complications: Secondary | ICD-10-CM | POA: Diagnosis not present

## 2018-11-20 DIAGNOSIS — Z6835 Body mass index (BMI) 35.0-35.9, adult: Secondary | ICD-10-CM | POA: Diagnosis not present

## 2018-11-20 DIAGNOSIS — E1165 Type 2 diabetes mellitus with hyperglycemia: Secondary | ICD-10-CM | POA: Diagnosis not present

## 2018-11-20 DIAGNOSIS — I10 Essential (primary) hypertension: Secondary | ICD-10-CM | POA: Diagnosis not present

## 2018-11-25 DIAGNOSIS — Z23 Encounter for immunization: Secondary | ICD-10-CM | POA: Diagnosis not present

## 2019-02-08 DIAGNOSIS — Z96643 Presence of artificial hip joint, bilateral: Secondary | ICD-10-CM | POA: Diagnosis not present

## 2019-02-08 DIAGNOSIS — M170 Bilateral primary osteoarthritis of knee: Secondary | ICD-10-CM | POA: Diagnosis not present

## 2019-03-25 DIAGNOSIS — Z23 Encounter for immunization: Secondary | ICD-10-CM | POA: Diagnosis not present

## 2019-04-30 DIAGNOSIS — Z23 Encounter for immunization: Secondary | ICD-10-CM | POA: Diagnosis not present

## 2019-05-31 DIAGNOSIS — M19041 Primary osteoarthritis, right hand: Secondary | ICD-10-CM | POA: Diagnosis not present

## 2019-05-31 DIAGNOSIS — M19042 Primary osteoarthritis, left hand: Secondary | ICD-10-CM | POA: Diagnosis not present

## 2019-05-31 DIAGNOSIS — M170 Bilateral primary osteoarthritis of knee: Secondary | ICD-10-CM | POA: Diagnosis not present

## 2019-07-05 DIAGNOSIS — Z1389 Encounter for screening for other disorder: Secondary | ICD-10-CM | POA: Diagnosis not present

## 2019-07-05 DIAGNOSIS — M1991 Primary osteoarthritis, unspecified site: Secondary | ICD-10-CM | POA: Diagnosis not present

## 2019-07-05 DIAGNOSIS — J310 Chronic rhinitis: Secondary | ICD-10-CM | POA: Diagnosis not present

## 2019-07-05 DIAGNOSIS — E6609 Other obesity due to excess calories: Secondary | ICD-10-CM | POA: Diagnosis not present

## 2019-07-05 DIAGNOSIS — Z96642 Presence of left artificial hip joint: Secondary | ICD-10-CM | POA: Diagnosis not present

## 2019-07-05 DIAGNOSIS — Z6835 Body mass index (BMI) 35.0-35.9, adult: Secondary | ICD-10-CM | POA: Diagnosis not present

## 2019-07-05 DIAGNOSIS — I10 Essential (primary) hypertension: Secondary | ICD-10-CM | POA: Diagnosis not present

## 2019-10-27 DIAGNOSIS — M170 Bilateral primary osteoarthritis of knee: Secondary | ICD-10-CM | POA: Diagnosis not present

## 2019-12-08 DIAGNOSIS — Z23 Encounter for immunization: Secondary | ICD-10-CM | POA: Diagnosis not present

## 2020-01-03 DIAGNOSIS — I10 Essential (primary) hypertension: Secondary | ICD-10-CM | POA: Diagnosis not present

## 2020-01-03 DIAGNOSIS — Z0001 Encounter for general adult medical examination with abnormal findings: Secondary | ICD-10-CM | POA: Diagnosis not present

## 2020-01-03 DIAGNOSIS — Z1389 Encounter for screening for other disorder: Secondary | ICD-10-CM | POA: Diagnosis not present

## 2020-01-03 DIAGNOSIS — Z96642 Presence of left artificial hip joint: Secondary | ICD-10-CM | POA: Diagnosis not present

## 2020-01-03 DIAGNOSIS — Z1331 Encounter for screening for depression: Secondary | ICD-10-CM | POA: Diagnosis not present

## 2020-01-03 DIAGNOSIS — Z6833 Body mass index (BMI) 33.0-33.9, adult: Secondary | ICD-10-CM | POA: Diagnosis not present

## 2020-01-03 DIAGNOSIS — M1991 Primary osteoarthritis, unspecified site: Secondary | ICD-10-CM | POA: Diagnosis not present

## 2020-01-03 DIAGNOSIS — Z96641 Presence of right artificial hip joint: Secondary | ICD-10-CM | POA: Diagnosis not present

## 2020-01-03 DIAGNOSIS — R600 Localized edema: Secondary | ICD-10-CM | POA: Diagnosis not present

## 2020-01-03 DIAGNOSIS — E7849 Other hyperlipidemia: Secondary | ICD-10-CM | POA: Diagnosis not present

## 2020-02-16 ENCOUNTER — Ambulatory Visit: Payer: Medicare Other

## 2020-02-16 ENCOUNTER — Ambulatory Visit (INDEPENDENT_AMBULATORY_CARE_PROVIDER_SITE_OTHER): Payer: Medicare Other | Admitting: Orthopedic Surgery

## 2020-02-16 ENCOUNTER — Other Ambulatory Visit: Payer: Self-pay

## 2020-02-16 VITALS — BP 186/80 | HR 73 | Ht 64.0 in | Wt 194.0 lb

## 2020-02-16 DIAGNOSIS — G8929 Other chronic pain: Secondary | ICD-10-CM

## 2020-02-16 DIAGNOSIS — M25561 Pain in right knee: Secondary | ICD-10-CM

## 2020-02-16 DIAGNOSIS — M25562 Pain in left knee: Secondary | ICD-10-CM

## 2020-02-16 NOTE — Progress Notes (Signed)
Chief Complaint  Patient presents with   Hip Pain    Had both replaced    Knee Pain    Both/ wants injections/ had done by Dr Case    86 year old female status post bilateral hip replacements according to the notes of Dr. Case patient was potentially going to be scheduled for pain management referral as the pain was thought to be coming from her back.  We will not be addressing that today she does have a history of degenerative disc disease lumbar spine  She has bilateral knee pain was receiving injections from Dr. Case and would like those repeated if appropriate  Past Medical History:  Diagnosis Date   Hypertension    Past Surgical History:  Procedure Laterality Date   ABDOMINAL HYSTERECTOMY     APPENDECTOMY     BREAST BIOPSY     CATARACT EXTRACTION W/PHACO Right 09/03/2012   Procedure: CATARACT EXTRACTION PHACO AND INTRAOCULAR LENS PLACEMENT ([place]);  Surgeon: Tonny Branch, MD;  Location: [facility];  Service: Ophthalmology;  Laterality: Right;  CDE: 12.37   CATARACT EXTRACTION W/PHACO Left 09/28/2012   Procedure: CATARACT EXTRACTION PHACO AND INTRAOCULAR LENS PLACEMENT (IOC);  Surgeon: Tonny Branch, MD;  Location: [facility];  Service: Ophthalmology;  Laterality: Left;  CDE 18.52   closed redution external fixation right wrist Right    INSERTION OF MESH N/A 06/30/2015   Procedure: INSERTION OF MESH;  Surgeon: Aviva Signs, MD;  Location: [facility];  Service: General;  Laterality: N/A;   JOINT REPLACEMENT     bilateral Hips at [facility] FUSION     Dr. [place] N/A 06/30/2015   Procedure: UMBILICAL HERNIORRHAPHY WITH MESH;  Surgeon: Aviva Signs, MD;  Location: [facility];  Service: General;  Laterality: N/A;   No family history on file. Social History   Tobacco Use   Smoking status: Never Smoker  Substance Use Topics   Alcohol use: No   Drug use: No   BP (!) 186/80    Pulse 73    Ht 5\' 4"  (1.626 m)    Wt 194 lb (88 kg)    BMI 33.30  kg/m   She is awake and alert he is oriented x3  Mood is pleasant affect is normal  Gait is supported by cane  Right knee Medial joint line tenderness Overall has an adequate range of motion No instability normal muscle tone  Left knee Medial joint line tenderness as well Slight decreased extension and flexion No instability Normal muscle tone  Data  70 pages of notes from Dr. Case are personally reviewed basically it indicates she had bilateral total hip successful  She has bilateral hip pain related to lumbar spine pathology  Multiple injections have been placed in both knees   New images right and left knee  Right knee x-ray Normal alignment medial joint line narrowing minimal osteophytes on the [facility] some on the axial patellar view with normal alignment of the patella  Left knee x-ray Varus alignment severe narrowing medial compartment peripheral osteophytes on the patella plan  Call for injections as needed every 3 to 4 months  Procedure note for bilateral knee injections  Procedure note left knee injection verbal consent was obtained to inject left knee joint  Timeout was completed to confirm the site of injection  The medications used were 40 mg of Depo-Medrol and 1% lidocaine 3 cc  Anesthesia was provided by ethyl chloride and the skin  was prepped with alcohol.  After cleaning the skin with alcohol a 20-gauge needle was used to inject the left knee joint. There were no complications. A sterile bandage was applied.   Procedure note right knee injection verbal consent was obtained to inject right knee joint  Timeout was completed to confirm the site of injection  The medications used were 40 mg of Depo-Medrol and 1% lidocaine 3 cc  Anesthesia was provided by ethyl chloride and the skin was prepped with alcohol.  After cleaning the skin with alcohol a 20-gauge needle was used to inject the right knee joint. There were no complications. A sterile bandage  was applied.  Encounter Diagnoses  Name Primary?   Chronic pain of left knee Yes   Chronic pain of right knee

## 2020-02-16 NOTE — Patient Instructions (Signed)
You may call to get an injection in 4 months if needed

## 2020-07-06 ENCOUNTER — Other Ambulatory Visit: Payer: Self-pay

## 2020-07-06 ENCOUNTER — Ambulatory Visit (INDEPENDENT_AMBULATORY_CARE_PROVIDER_SITE_OTHER): Payer: Medicare Other | Admitting: Orthopedic Surgery

## 2020-07-06 ENCOUNTER — Encounter: Payer: Self-pay | Admitting: Orthopedic Surgery

## 2020-07-06 VITALS — BP 188/101 | HR 92 | Ht 64.0 in | Wt 195.0 lb

## 2020-07-06 DIAGNOSIS — M25562 Pain in left knee: Secondary | ICD-10-CM | POA: Diagnosis not present

## 2020-07-06 DIAGNOSIS — M25561 Pain in right knee: Secondary | ICD-10-CM | POA: Diagnosis not present

## 2020-07-06 DIAGNOSIS — G8929 Other chronic pain: Secondary | ICD-10-CM | POA: Diagnosis not present

## 2020-07-06 NOTE — Progress Notes (Signed)
Chief Complaint  Patient presents with  . Injections    Both knees     Encounter Diagnoses  Name Primary?  . Chronic pain of left knee Yes  . Chronic pain of right knee     Procedure note for bilateral knee injections  Procedure note left knee injection verbal consent was obtained to inject left knee joint  Timeout was completed to confirm the site of injection  The medications used were 6 mg Celestone with Sensorcaine Anesthesia was provided by ethyl chloride and the skin was prepped with alcohol.  After cleaning the skin with alcohol a 20-gauge needle was used to inject the left knee joint. There were no complications. A sterile bandage was applied.   Procedure note right knee injection verbal consent was obtained to inject right knee joint  Timeout was completed to confirm the site of injection  The medications used were 6 mg Celestone with Sensorcaine Anesthesia was provided by ethyl chloride and the skin was prepped with alcohol.  After cleaning the skin with alcohol a 20-gauge needle was used to inject the right knee joint. There were no complications. A sterile bandage was applied.

## 2020-07-10 DIAGNOSIS — R7309 Other abnormal glucose: Secondary | ICD-10-CM | POA: Diagnosis not present

## 2020-07-10 DIAGNOSIS — J302 Other seasonal allergic rhinitis: Secondary | ICD-10-CM | POA: Diagnosis not present

## 2020-07-10 DIAGNOSIS — E7849 Other hyperlipidemia: Secondary | ICD-10-CM | POA: Diagnosis not present

## 2020-07-10 DIAGNOSIS — Z1331 Encounter for screening for depression: Secondary | ICD-10-CM | POA: Diagnosis not present

## 2020-07-10 DIAGNOSIS — Z6833 Body mass index (BMI) 33.0-33.9, adult: Secondary | ICD-10-CM | POA: Diagnosis not present

## 2020-10-02 DIAGNOSIS — W19XXXA Unspecified fall, initial encounter: Secondary | ICD-10-CM | POA: Diagnosis not present

## 2020-10-02 DIAGNOSIS — E6609 Other obesity due to excess calories: Secondary | ICD-10-CM | POA: Diagnosis not present

## 2020-10-02 DIAGNOSIS — Z6833 Body mass index (BMI) 33.0-33.9, adult: Secondary | ICD-10-CM | POA: Diagnosis not present

## 2020-10-02 DIAGNOSIS — S80812A Abrasion, left lower leg, initial encounter: Secondary | ICD-10-CM | POA: Diagnosis not present

## 2020-11-28 DIAGNOSIS — Z20822 Contact with and (suspected) exposure to covid-19: Secondary | ICD-10-CM | POA: Diagnosis not present

## 2020-11-28 DIAGNOSIS — Z23 Encounter for immunization: Secondary | ICD-10-CM | POA: Diagnosis not present

## 2021-01-31 DIAGNOSIS — Z20822 Contact with and (suspected) exposure to covid-19: Secondary | ICD-10-CM | POA: Diagnosis not present

## 2021-04-03 DIAGNOSIS — Z20822 Contact with and (suspected) exposure to covid-19: Secondary | ICD-10-CM | POA: Diagnosis not present

## 2021-05-09 DIAGNOSIS — R7309 Other abnormal glucose: Secondary | ICD-10-CM | POA: Diagnosis not present

## 2021-05-09 DIAGNOSIS — R350 Frequency of micturition: Secondary | ICD-10-CM | POA: Diagnosis not present

## 2021-05-09 DIAGNOSIS — E7849 Other hyperlipidemia: Secondary | ICD-10-CM | POA: Diagnosis not present

## 2021-05-09 DIAGNOSIS — Z0001 Encounter for general adult medical examination with abnormal findings: Secondary | ICD-10-CM | POA: Diagnosis not present

## 2021-05-09 DIAGNOSIS — M1991 Primary osteoarthritis, unspecified site: Secondary | ICD-10-CM | POA: Diagnosis not present

## 2021-05-09 DIAGNOSIS — E559 Vitamin D deficiency, unspecified: Secondary | ICD-10-CM | POA: Diagnosis not present

## 2021-05-09 DIAGNOSIS — J302 Other seasonal allergic rhinitis: Secondary | ICD-10-CM | POA: Diagnosis not present

## 2021-05-09 DIAGNOSIS — Z1331 Encounter for screening for depression: Secondary | ICD-10-CM | POA: Diagnosis not present

## 2021-05-09 DIAGNOSIS — E782 Mixed hyperlipidemia: Secondary | ICD-10-CM | POA: Diagnosis not present

## 2021-05-09 DIAGNOSIS — Z6821 Body mass index (BMI) 21.0-21.9, adult: Secondary | ICD-10-CM | POA: Diagnosis not present

## 2021-07-26 DIAGNOSIS — W57XXXA Bitten or stung by nonvenomous insect and other nonvenomous arthropods, initial encounter: Secondary | ICD-10-CM | POA: Diagnosis not present

## 2021-07-26 DIAGNOSIS — E6609 Other obesity due to excess calories: Secondary | ICD-10-CM | POA: Diagnosis not present

## 2021-07-26 DIAGNOSIS — Z6831 Body mass index (BMI) 31.0-31.9, adult: Secondary | ICD-10-CM | POA: Diagnosis not present

## 2021-07-26 DIAGNOSIS — S0006XA Insect bite (nonvenomous) of scalp, initial encounter: Secondary | ICD-10-CM | POA: Diagnosis not present

## 2022-01-31 DIAGNOSIS — L0202 Furuncle of face: Secondary | ICD-10-CM | POA: Diagnosis not present

## 2022-01-31 DIAGNOSIS — E6609 Other obesity due to excess calories: Secondary | ICD-10-CM | POA: Diagnosis not present

## 2022-01-31 DIAGNOSIS — Z6833 Body mass index (BMI) 33.0-33.9, adult: Secondary | ICD-10-CM | POA: Diagnosis not present

## 2022-02-01 DIAGNOSIS — L0202 Furuncle of face: Secondary | ICD-10-CM | POA: Diagnosis not present

## 2022-02-04 DIAGNOSIS — Z23 Encounter for immunization: Secondary | ICD-10-CM | POA: Diagnosis not present

## 2022-02-11 DIAGNOSIS — U071 COVID-19: Secondary | ICD-10-CM | POA: Diagnosis not present

## 2022-05-10 DIAGNOSIS — M1991 Primary osteoarthritis, unspecified site: Secondary | ICD-10-CM | POA: Diagnosis not present

## 2022-05-10 DIAGNOSIS — J302 Other seasonal allergic rhinitis: Secondary | ICD-10-CM | POA: Diagnosis not present

## 2022-05-10 DIAGNOSIS — Z0001 Encounter for general adult medical examination with abnormal findings: Secondary | ICD-10-CM | POA: Diagnosis not present

## 2022-05-10 DIAGNOSIS — Z6833 Body mass index (BMI) 33.0-33.9, adult: Secondary | ICD-10-CM | POA: Diagnosis not present

## 2022-05-10 DIAGNOSIS — Z1331 Encounter for screening for depression: Secondary | ICD-10-CM | POA: Diagnosis not present

## 2022-05-10 DIAGNOSIS — E782 Mixed hyperlipidemia: Secondary | ICD-10-CM | POA: Diagnosis not present

## 2022-05-10 DIAGNOSIS — Z96641 Presence of right artificial hip joint: Secondary | ICD-10-CM | POA: Diagnosis not present

## 2022-05-10 DIAGNOSIS — E7849 Other hyperlipidemia: Secondary | ICD-10-CM | POA: Diagnosis not present

## 2022-05-10 DIAGNOSIS — R7309 Other abnormal glucose: Secondary | ICD-10-CM | POA: Diagnosis not present

## 2022-05-10 DIAGNOSIS — E6609 Other obesity due to excess calories: Secondary | ICD-10-CM | POA: Diagnosis not present

## 2022-05-10 DIAGNOSIS — Z96642 Presence of left artificial hip joint: Secondary | ICD-10-CM | POA: Diagnosis not present

## 2022-05-10 DIAGNOSIS — M810 Age-related osteoporosis without current pathological fracture: Secondary | ICD-10-CM | POA: Diagnosis not present

## 2022-07-13 ENCOUNTER — Other Ambulatory Visit: Payer: Self-pay

## 2022-07-13 ENCOUNTER — Inpatient Hospital Stay (HOSPITAL_COMMUNITY)
Admission: EM | Admit: 2022-07-13 | Discharge: 2022-07-15 | DRG: 689 | Disposition: A | Discharge status: Home / Self Care | Payer: HMO | Attending: Internal Medicine | Admitting: Internal Medicine

## 2022-07-13 ENCOUNTER — Encounter (HOSPITAL_COMMUNITY): Payer: Self-pay

## 2022-07-13 ENCOUNTER — Emergency Department (HOSPITAL_COMMUNITY): Payer: HMO

## 2022-07-13 DIAGNOSIS — K219 Gastro-esophageal reflux disease without esophagitis: Secondary | ICD-10-CM | POA: Diagnosis present

## 2022-07-13 DIAGNOSIS — N3001 Acute cystitis with hematuria: Secondary | ICD-10-CM | POA: Diagnosis present

## 2022-07-13 DIAGNOSIS — R600 Localized edema: Secondary | ICD-10-CM | POA: Diagnosis not present

## 2022-07-13 DIAGNOSIS — M1990 Unspecified osteoarthritis, unspecified site: Secondary | ICD-10-CM | POA: Diagnosis present

## 2022-07-13 DIAGNOSIS — Z96643 Presence of artificial hip joint, bilateral: Secondary | ICD-10-CM | POA: Diagnosis present

## 2022-07-13 DIAGNOSIS — Z961 Presence of intraocular lens: Secondary | ICD-10-CM | POA: Diagnosis present

## 2022-07-13 DIAGNOSIS — R509 Fever, unspecified: Secondary | ICD-10-CM | POA: Diagnosis not present

## 2022-07-13 DIAGNOSIS — Z6833 Body mass index (BMI) 33.0-33.9, adult: Secondary | ICD-10-CM | POA: Diagnosis not present

## 2022-07-13 DIAGNOSIS — I878 Other specified disorders of veins: Secondary | ICD-10-CM | POA: Diagnosis present

## 2022-07-13 DIAGNOSIS — E669 Obesity, unspecified: Secondary | ICD-10-CM | POA: Diagnosis present

## 2022-07-13 DIAGNOSIS — R531 Weakness: Secondary | ICD-10-CM | POA: Diagnosis present

## 2022-07-13 DIAGNOSIS — N3000 Acute cystitis without hematuria: Secondary | ICD-10-CM | POA: Diagnosis not present

## 2022-07-13 DIAGNOSIS — G9341 Metabolic encephalopathy: Secondary | ICD-10-CM | POA: Diagnosis present

## 2022-07-13 DIAGNOSIS — I10 Essential (primary) hypertension: Secondary | ICD-10-CM | POA: Diagnosis present

## 2022-07-13 DIAGNOSIS — Z90710 Acquired absence of both cervix and uterus: Secondary | ICD-10-CM | POA: Diagnosis not present

## 2022-07-13 DIAGNOSIS — Z981 Arthrodesis status: Secondary | ICD-10-CM

## 2022-07-13 DIAGNOSIS — N390 Urinary tract infection, site not specified: Secondary | ICD-10-CM | POA: Insufficient documentation

## 2022-07-13 DIAGNOSIS — R110 Nausea: Secondary | ICD-10-CM | POA: Diagnosis not present

## 2022-07-13 DIAGNOSIS — R609 Edema, unspecified: Secondary | ICD-10-CM | POA: Diagnosis not present

## 2022-07-13 DIAGNOSIS — R0689 Other abnormalities of breathing: Secondary | ICD-10-CM | POA: Diagnosis not present

## 2022-07-13 LAB — URINALYSIS, ROUTINE W REFLEX MICROSCOPIC
Bilirubin Urine: NEGATIVE
Glucose, UA: NEGATIVE mg/dL
Ketones, ur: 20 mg/dL — AB
Nitrite: NEGATIVE
Protein, ur: 30 mg/dL — AB
Specific Gravity, Urine: 1.018 (ref 1.005–1.030)
pH: 6 (ref 5.0–8.0)

## 2022-07-13 LAB — CBC WITH DIFFERENTIAL/PLATELET
Abs Immature Granulocytes: 0.05 10*3/uL (ref 0.00–0.07)
Basophils Absolute: 0 10*3/uL (ref 0.0–0.1)
Basophils Relative: 0 %
Eosinophils Absolute: 0.3 10*3/uL (ref 0.0–0.5)
Eosinophils Relative: 2 %
HCT: 39.7 % (ref 36.0–46.0)
Hemoglobin: 13.3 g/dL (ref 12.0–15.0)
Immature Granulocytes: 0 %
Lymphocytes Relative: 5 %
Lymphs Abs: 0.8 10*3/uL (ref 0.7–4.0)
MCH: 31 pg (ref 26.0–34.0)
MCHC: 33.5 g/dL (ref 30.0–36.0)
MCV: 92.5 fL (ref 80.0–100.0)
Monocytes Absolute: 0.9 10*3/uL (ref 0.1–1.0)
Monocytes Relative: 5 %
Neutro Abs: 14.2 10*3/uL — ABNORMAL HIGH (ref 1.7–7.7)
Neutrophils Relative %: 88 %
Platelets: 187 10*3/uL (ref 150–400)
RBC: 4.29 MIL/uL (ref 3.87–5.11)
RDW: 12.7 % (ref 11.5–15.5)
WBC: 16.3 10*3/uL — ABNORMAL HIGH (ref 4.0–10.5)
nRBC: 0 % (ref 0.0–0.2)

## 2022-07-13 LAB — COMPREHENSIVE METABOLIC PANEL
ALT: 13 U/L (ref 0–44)
AST: 16 U/L (ref 15–41)
Albumin: 3.7 g/dL (ref 3.5–5.0)
Alkaline Phosphatase: 51 U/L (ref 38–126)
Anion gap: 9 (ref 5–15)
BUN: 15 mg/dL (ref 8–23)
CO2: 24 mmol/L (ref 22–32)
Calcium: 8.5 mg/dL — ABNORMAL LOW (ref 8.9–10.3)
Chloride: 99 mmol/L (ref 98–111)
Creatinine, Ser: 0.98 mg/dL (ref 0.44–1.00)
GFR, Estimated: 56 mL/min — ABNORMAL LOW (ref >60)
Glucose, Bld: 106 mg/dL — ABNORMAL HIGH (ref 70–99)
Potassium: 3.7 mmol/L (ref 3.5–5.1)
Sodium: 132 mmol/L — ABNORMAL LOW (ref 135–145)
Total Bilirubin: 1.4 mg/dL — ABNORMAL HIGH (ref 0.3–1.2)
Total Protein: 6.7 g/dL (ref 6.5–8.1)

## 2022-07-13 MED ORDER — ONDANSETRON HCL 4 MG/2ML IJ SOLN: 4.0000 mg | Freq: Four times a day (QID) | INTRAMUSCULAR | Status: DC | PRN

## 2022-07-13 MED ORDER — HEPARIN SODIUM (PORCINE) 5000 UNIT/ML IJ SOLN
5000.0000 [IU] | Freq: Three times a day (TID) | INTRAMUSCULAR | Status: DC
  Administered 2022-07-13 – 2022-07-15 (×6): 5000 [IU] via SUBCUTANEOUS
  Filled 2022-07-13 (×6): qty 1

## 2022-07-13 MED ORDER — SODIUM CHLORIDE 0.9 % IV BOLUS
1000.0000 mL | Freq: Once | INTRAVENOUS | Status: AC
  Administered 2022-07-13: 1000 mL via INTRAVENOUS

## 2022-07-13 MED ORDER — SODIUM CHLORIDE 0.9 % IV SOLN
2.0000 g | Freq: Once | INTRAVENOUS | Status: AC
  Administered 2022-07-13: 2 g via INTRAVENOUS
  Filled 2022-07-13: qty 20

## 2022-07-13 MED ORDER — OXYCODONE HCL 5 MG PO TABS: 5.0000 mg | ORAL_TABLET | ORAL | Status: DC | PRN

## 2022-07-13 MED ORDER — ONDANSETRON HCL 4 MG PO TABS: 4.0000 mg | ORAL_TABLET | Freq: Four times a day (QID) | ORAL | Status: DC | PRN

## 2022-07-13 MED ORDER — ACETAMINOPHEN 650 MG RE SUPP: 650.0000 mg | Freq: Four times a day (QID) | RECTAL | Status: DC | PRN

## 2022-07-13 MED ORDER — ACETAMINOPHEN 325 MG PO TABS: 650.0000 mg | ORAL_TABLET | Freq: Four times a day (QID) | ORAL | Status: DC | PRN

## 2022-07-13 NOTE — ED Triage Notes (Signed)
Pt BIB [facility] from home with c/o nausea and generalized weakness that started today. Pt spouse at bedside. Vitals WNL per EMS. EMS report he noticed foul smelling urine odor and concerned pt may have a UTI

## 2022-07-13 NOTE — ED Provider Notes (Signed)
[place] EMERGENCY DEPARTMENT AT [facility] Provider Note   CSN: [id number] Arrival date & time: 07/13/22  1721     History {Add pertinent medical, surgical, social history, OB history to HPI:1} Chief Complaint  Patient presents with   Weakness   Nausea    [name] is a 89 y.o. female.  Patient with history of GERD.  She complains of mild fever and weakness and foul-smelling urine.   Weakness      Home Medications Prior to Admission medications   Not on File      Allergies    Patient has no known allergies.    Review of Systems   Review of Systems  Neurological:  Positive for weakness.    Physical Exam Updated Vital Signs BP (!) 151/64 (BP Location: Left Arm)   Pulse 89   Temp 99.7 F (37.6 C) (Oral)   Resp (!) 22   Ht 5\' 4"  (1.626 m)   Wt 88.5 kg   SpO2 92%   BMI 33.49 kg/m  Physical Exam  ED Results / Procedures / Treatments   Labs (all labs ordered are listed, but only abnormal results are displayed) Labs Reviewed  CBC WITH DIFFERENTIAL/PLATELET - Abnormal; Notable for the following components:      Result Value   WBC 16.3 (*)    Neutro Abs 14.2 (*)    All other components within normal limits  COMPREHENSIVE METABOLIC PANEL - Abnormal; Notable for the following components:   Sodium 132 (*)    Glucose, Bld 106 (*)    Calcium 8.5 (*)    Total Bilirubin 1.4 (*)    GFR, Estimated 56 (*)    All other components within normal limits  URINALYSIS, ROUTINE W REFLEX MICROSCOPIC - Abnormal; Notable for the following components:   APPearance HAZY (*)    Hgb urine dipstick SMALL (*)    Ketones, ur 20 (*)    Protein, ur 30 (*)    Leukocytes,Ua MODERATE (*)    Bacteria, UA RARE (*)    All other components within normal limits  URINE CULTURE    EKG None  Radiology DG Chest Port 1 View  Result Date: 07/13/2022 CLINICAL DATA:  Weakness EXAM: PORTABLE CHEST 1 VIEW COMPARISON:  01/16/2005 FINDINGS: Cardiac shadow is enlarged  but stable. Lungs are well aerated bilaterally. Mild central vascular congestion is noted. Left basilar atelectasis is seen. No bony abnormality is noted. IMPRESSION: Mild left basilar atelectasis and vascular congestion. Electronically Signed   By: Alcide Clever M.D.   On: 07/13/2022 19:01    Procedures Procedures  {Document cardiac monitor, telemetry assessment procedure when appropriate:1}  Medications Ordered in ED Medications  cefTRIAXone (ROCEPHIN) 2 g in sodium chloride 0.9 % 100 mL IVPB (2 g Intravenous New Bag/Given 07/13/22 2010)  sodium chloride 0.9 % bolus 1,000 mL (0 mLs Intravenous Stopped 07/13/22 2013)    ED Course/ Medical Decision Making/ A&P   {   Click here for ABCD2, HEART and other calculatorsREFRESH Note before signing :1}                          Medical Decision Making Amount and/or Complexity of Data Reviewed Labs: ordered. Radiology: ordered.  Risk Decision regarding hospitalization.   Patient with urinary tract infection.  She will be admitted to medicine treated with IV antibiotics  {Document critical care time when appropriate:1} {Document review of labs and clinical decision tools ie heart score, Chads2Vasc2  etc:1}  {Document your independent review of radiology images, and any outside records:1} {Document your discussion with family members, caretakers, and with consultants:1} {Document social determinants of health affecting pt's care:1} {Document your decision making why or why not admission, treatments were needed:1} Final Clinical Impression(s) / ED Diagnoses Final diagnoses:  Acute cystitis with hematuria    Rx / DC Orders ED Discharge Orders     None

## 2022-07-14 DIAGNOSIS — Z961 Presence of intraocular lens: Secondary | ICD-10-CM | POA: Diagnosis present

## 2022-07-14 DIAGNOSIS — R531 Weakness: Secondary | ICD-10-CM

## 2022-07-14 DIAGNOSIS — R600 Localized edema: Secondary | ICD-10-CM | POA: Diagnosis not present

## 2022-07-14 DIAGNOSIS — Z96643 Presence of artificial hip joint, bilateral: Secondary | ICD-10-CM | POA: Diagnosis present

## 2022-07-14 DIAGNOSIS — Z981 Arthrodesis status: Secondary | ICD-10-CM | POA: Diagnosis not present

## 2022-07-14 DIAGNOSIS — K219 Gastro-esophageal reflux disease without esophagitis: Secondary | ICD-10-CM | POA: Diagnosis present

## 2022-07-14 DIAGNOSIS — Z6833 Body mass index (BMI) 33.0-33.9, adult: Secondary | ICD-10-CM | POA: Diagnosis not present

## 2022-07-14 DIAGNOSIS — E669 Obesity, unspecified: Secondary | ICD-10-CM | POA: Diagnosis present

## 2022-07-14 DIAGNOSIS — N3000 Acute cystitis without hematuria: Secondary | ICD-10-CM | POA: Diagnosis not present

## 2022-07-14 DIAGNOSIS — I10 Essential (primary) hypertension: Secondary | ICD-10-CM | POA: Diagnosis present

## 2022-07-14 DIAGNOSIS — M1990 Unspecified osteoarthritis, unspecified site: Secondary | ICD-10-CM | POA: Diagnosis present

## 2022-07-14 DIAGNOSIS — G9341 Metabolic encephalopathy: Secondary | ICD-10-CM | POA: Diagnosis present

## 2022-07-14 DIAGNOSIS — Z90710 Acquired absence of both cervix and uterus: Secondary | ICD-10-CM | POA: Diagnosis not present

## 2022-07-14 DIAGNOSIS — I878 Other specified disorders of veins: Secondary | ICD-10-CM | POA: Diagnosis present

## 2022-07-14 DIAGNOSIS — N3001 Acute cystitis with hematuria: Secondary | ICD-10-CM | POA: Diagnosis present

## 2022-07-14 DIAGNOSIS — N390 Urinary tract infection, site not specified: Secondary | ICD-10-CM | POA: Insufficient documentation

## 2022-07-14 LAB — COMPREHENSIVE METABOLIC PANEL
ALT: 12 U/L (ref 0–44)
AST: 15 U/L (ref 15–41)
Albumin: 3.1 g/dL — ABNORMAL LOW (ref 3.5–5.0)
Alkaline Phosphatase: 43 U/L (ref 38–126)
Anion gap: 9 (ref 5–15)
BUN: 14 mg/dL (ref 8–23)
CO2: 23 mmol/L (ref 22–32)
Calcium: 8.1 mg/dL — ABNORMAL LOW (ref 8.9–10.3)
Chloride: 102 mmol/L (ref 98–111)
Creatinine, Ser: 0.95 mg/dL (ref 0.44–1.00)
GFR, Estimated: 58 mL/min — ABNORMAL LOW (ref >60)
Glucose, Bld: 92 mg/dL (ref 70–99)
Potassium: 3.1 mmol/L — ABNORMAL LOW (ref 3.5–5.1)
Sodium: 134 mmol/L — ABNORMAL LOW (ref 135–145)
Total Bilirubin: 1 mg/dL (ref 0.3–1.2)
Total Protein: 5.6 g/dL — ABNORMAL LOW (ref 6.5–8.1)

## 2022-07-14 LAB — CBC WITH DIFFERENTIAL/PLATELET
Abs Immature Granulocytes: 0.04 10*3/uL (ref 0.00–0.07)
Basophils Absolute: 0 10*3/uL (ref 0.0–0.1)
Basophils Relative: 0 %
Eosinophils Absolute: 0 10*3/uL (ref 0.0–0.5)
Eosinophils Relative: 0 %
HCT: 36 % (ref 36.0–46.0)
Hemoglobin: 11.8 g/dL — ABNORMAL LOW (ref 12.0–15.0)
Immature Granulocytes: 0 %
Lymphocytes Relative: 12 %
Lymphs Abs: 1.3 10*3/uL (ref 0.7–4.0)
MCH: 31 pg (ref 26.0–34.0)
MCHC: 32.8 g/dL (ref 30.0–36.0)
MCV: 94.5 fL (ref 80.0–100.0)
Monocytes Absolute: 0.8 10*3/uL (ref 0.1–1.0)
Monocytes Relative: 7 %
Neutro Abs: 8.3 10*3/uL — ABNORMAL HIGH (ref 1.7–7.7)
Neutrophils Relative %: 81 %
Platelets: 178 10*3/uL (ref 150–400)
RBC: 3.81 MIL/uL — ABNORMAL LOW (ref 3.87–5.11)
RDW: 12.7 % (ref 11.5–15.5)
WBC: 10.5 10*3/uL (ref 4.0–10.5)
nRBC: 0 % (ref 0.0–0.2)

## 2022-07-14 LAB — BRAIN NATRIURETIC PEPTIDE: B Natriuretic Peptide: 209 pg/mL — ABNORMAL HIGH (ref 0.0–100.0)

## 2022-07-14 LAB — MAGNESIUM: Magnesium: 1.9 mg/dL (ref 1.7–2.4)

## 2022-07-14 MED ORDER — SODIUM CHLORIDE 0.9 % IV SOLN
1.0000 g | INTRAVENOUS | Status: DC
  Administered 2022-07-14: 1 g via INTRAVENOUS
  Filled 2022-07-14: qty 10

## 2022-07-14 MED ORDER — FUROSEMIDE 10 MG/ML IJ SOLN
20.0000 mg | Freq: Once | INTRAMUSCULAR | Status: AC
  Administered 2022-07-14: 20 mg via INTRAVENOUS
  Filled 2022-07-14: qty 2

## 2022-07-14 MED ORDER — POTASSIUM CHLORIDE CRYS ER 20 MEQ PO TBCR
40.0000 meq | EXTENDED_RELEASE_TABLET | Freq: Once | ORAL | Status: AC
  Administered 2022-07-14: 40 meq via ORAL
  Filled 2022-07-14: qty 2

## 2022-07-14 MED ORDER — METOPROLOL TARTRATE 50 MG PO TABS
100.0000 mg | ORAL_TABLET | Freq: Two times a day (BID) | ORAL | Status: DC
  Administered 2022-07-14 – 2022-07-15 (×4): 100 mg via ORAL
  Filled 2022-07-14 (×4): qty 2

## 2022-07-14 MED ORDER — ASPIRIN 81 MG PO TBEC
81.0000 mg | DELAYED_RELEASE_TABLET | Freq: Every day | ORAL | Status: DC
  Administered 2022-07-14 – 2022-07-15 (×2): 81 mg via ORAL
  Filled 2022-07-14 (×2): qty 1

## 2022-07-14 MED ORDER — DILTIAZEM HCL ER COATED BEADS 180 MG PO CP24
180.0000 mg | ORAL_CAPSULE | Freq: Every day | ORAL | Status: DC
  Administered 2022-07-14 – 2022-07-15 (×2): 180 mg via ORAL
  Filled 2022-07-14 (×2): qty 1

## 2022-07-14 MED ORDER — ZINC OXIDE 12.8 % EX OINT
TOPICAL_OINTMENT | CUTANEOUS | Status: DC | PRN
  Filled 2022-07-14: qty 56.7

## 2022-07-14 NOTE — Progress Notes (Signed)
Patient seen and examined; admitted after midnight secondary to generalized weakness, nausea and some confusion.  Workup demonstrating UTI.  IV fluids and antibiotics have been started; at time of my examination mentation has improved and per neighbor at bedside back to baseline.  She demonstrated to be weak but in no acute distress.  Follow culture results, continue current antibiotics and follow recommendations by physical therapy assessment for safe discharge plan. Please refer to H&P written by Dr.Zierle-Ghosh for further info/details on admission.  Vassie Loll MD [phone]

## 2022-07-14 NOTE — H&P (Signed)
History and Physical    Patient: [name] RUE:[id number] DOB: 02/06/1932 DOA: 07/13/2022 DOS: the patient was seen and examined on 07/14/2022 PCP: Assunta Found, MD  Patient coming from: Home  Chief Complaint:  Chief Complaint  Patient presents with   Weakness   Nausea   HPI: [name] is a 89 y.o. female with medical history significant of osteoarthritis, hypertension, presents the ED with friends at bedside complaining of altered mental status and weakness.  Friends reported the patient has been more confused than normal.  She is also had some generalized weakness.  Somebody reported foul-smelling order to the ED provider.  UA was indicative of UTI.  Urine culture pending.  Patient was started on Rocephin.  Unfortunately, patient is not able to provide any history at this time.  She will not open her eyes for examination.  She answers " mmhmm" to every question, but will not actually speak.  My impression is that this will improve in the morning, with treatment infection and after a night of rest.  No further history could be obtained at this time.  Patient is full code as we do not have any ACP documents and she is not able to have this conversation at this time. Review of Systems: unable to review all systems due to the inability of the patient to answer questions. Past Medical History:  Diagnosis Date   Bilateral hip joint arthritis 09/06/2013   Degeneration of intervertebral disc of lumbar region 09/06/2013   Hypertension    Past Surgical History:  Procedure Laterality Date   ABDOMINAL HYSTERECTOMY     APPENDECTOMY     BREAST BIOPSY     CATARACT EXTRACTION W/PHACO Right 09/03/2012   Procedure: CATARACT EXTRACTION PHACO AND INTRAOCULAR LENS PLACEMENT (IOC);  Surgeon: Gemma Payor, MD;  Location: [facility];  Service: Ophthalmology;  Laterality: Right;  CDE: 12.37   CATARACT EXTRACTION W/PHACO Left 09/28/2012   Procedure: CATARACT EXTRACTION PHACO AND INTRAOCULAR LENS  PLACEMENT (IOC);  Surgeon: Gemma Payor, MD;  Location: [facility];  Service: Ophthalmology;  Laterality: Left;  CDE 18.52   closed redution external fixation right wrist Right    INSERTION OF MESH N/A 06/30/2015   Procedure: INSERTION OF MESH;  Surgeon: Franky Macho, MD;  Location: [facility];  Service: General;  Laterality: N/A;   JOINT REPLACEMENT     bilateral Hips at [facility] FUSION     Dr. Channing Mutters   TONSILLECTOMY     UMBILICAL HERNIA REPAIR N/A 06/30/2015   Procedure: UMBILICAL HERNIORRHAPHY WITH MESH;  Surgeon: Franky Macho, MD;  Location: [facility];  Service: General;  Laterality: N/A;   Social History:  reports that she has never smoked. She has never used smokeless tobacco. She reports that she does not drink alcohol and does not use drugs.  No Known Allergies  History reviewed. No pertinent family history.  Prior to Admission medications   Not on File    Physical Exam: Vitals:   07/13/22 2100 07/13/22 2134 07/13/22 2137 07/14/22 0204  BP:   (!) 155/59 132/60  Pulse:   82 73  Resp:   20 (!) 22  Temp:   99.4 F (37.4 C) 99 F (37.2 C)  TempSrc:      SpO2:  94% 94% 95%  Weight: 90.2 kg     Height:       1.  General: Patient lying supine in bed,  no acute distress   2. Psychiatric: Somnolent, does not wake for  exam   3. Neurologic: Protecting airway, answers to voice, but will not speak enough to give history, face is symmetric   4. HEENMT:  Head is atraumatic, normocephalic, pupils reactive to light, neck is supple, trachea is midline, mucous membranes are moist   5. Respiratory : Lungs are clear to auscultation bilaterally without wheezing, rhonchi, rales, no cyanosis, no increase in work of breathing or accessory muscle use   6. Cardiovascular : Heart rate normal, rhythm is regular, no murmurs, rubs or gallops, significant peripheral edema with blistering, peripheral pulses palpated   7. Gastrointestinal:  Abdomen is soft, nondistended, nontender to palpation  bowel sounds active, no masses or organomegaly palpated   8. Skin:  Skin is warm, dry and intact without rashes, acute lesions, or ulcers on limited exam   9.Musculoskeletal:  No acute deformities or trauma, no asymmetry in tone, peripheral pulses palpated, no tenderness to palpation in the extremities  Data Reviewed: In the ED Patient is afebrile, heart rate is normal at 70-89, respiratory rate 18-22, blood pressure 151/64 Leukocytosis at 16.3, hemoglobin 13.3 Chemistry is unremarkable UA is indicative of UTI Urine culture pending Rocephin 2 g started in the ED Chest x-ray shows atelectasis and vascular congestion Normal saline 1 L given in the ED Admission requested for altered mental status, generalized weakness, UTI Assessment and Plan: * Acute metabolic encephalopathy - Likely secondary to infection - Leukocytosis, UA indicative of UTI - 1 L of fluid given in the ED - Rocephin started, continue Rocephin - Urine culture pending - Patient is unfortunately not able to give any history but friends at bedside in the ED - The patient was confused and basically not thinking clearly like she usually does - Patient lives alone - Admitted for observation, mentation is likely to improve with treatment of infection - Possibly be discharged within 24 hours  Peripheral edema - Evidence of chronic venous stasis - Blistering with no weeping - There is also vascular congestion on chest x-ray - No documented history of CHF - Consider echo in the a.m. - One-time dose of Lasix - Continue to monitor  UTI (urinary tract infection) - UA indicative of UTI - Urine culture pending - Leukocytosis and altered mental status - Rocephin started in the ED, continue Rocephin - Continue to monitor  Generalized weakness - PT eval and treat  Essential hypertension - Continue metoprolol and diltiazem - Blood pressure 151/64 in the ED      Advance Care Planning:   Code Status: Full Code by  default  Consults: None at this time  Family Communication: No family at bedside  Severity of Illness: The appropriate patient status for this patient is OBSERVATION. Observation status is judged to be reasonable and necessary in order to provide the required intensity of service to ensure the patient's safety. The patient's presenting symptoms, physical exam findings, and initial radiographic and laboratory data in the context of their medical condition is felt to place them at decreased risk for further clinical deterioration. Furthermore, it is anticipated that the patient will be medically stable for discharge from the hospital within 2 midnights of admission.   Author: Lilyan Gilford, DO 07/14/2022 2:29 AM  For on call review [email or web address].

## 2022-07-14 NOTE — Evaluation (Signed)
Physical Therapy Evaluation Patient Details Name: [name] MRN: [id number] DOB: April 18, 1931 Today's Date: 07/14/2022  History of Present Illness  [name] is a 89 y.o. female with medical history significant of osteoarthritis, hypertension, presents the ED with friends at bedside complaining of altered mental status and weakness.  Friends reported the patient has been more confused than normal.  She is also had some generalized weakness.  Somebody reported foul-smelling order to the ED provider.  UA was indicative of UTI.  Urine culture pending.  Patient was started on Rocephin.  Unfortunately, patient is not able to provide any history at this time.  She will not open her eyes for examination.  She answers " mmhmm" to every question, but will not actually speak.  My impression is that this will improve in the morning, with treatment infection and after a night of rest.  No further history could be obtained at this time.    Clinical Impression  On therapist arrival; patient is sleeping; nursing present.  Patient rouses easily and is agreeable to therapy assessment.  Patient performs supine to sit with head of bed slightly elevated taking extra time and effort to perform with stand by assistance from therapist; she has to use her arms on bed railing to assist trunk up to sitting.  Once sitting, patient takes a moment to regain her balance and can sit with feet and hands supported with therapist stand by assistance; slight forward flexed trunk.  Patient performs sit to stand to RW with minimal assist to boost up to standing from therapist and walks to the bathroom with RW and CGA for safety with slow gait speed.  Able to sit to toilet using grab bars to control descent.  Patient with noted rash on buttocks, nursing notified.  Patient then performs sit to stand with min A from therapist; able to stand to clean herself after toileting and ambulates with RW to chair.  Needs cues for hand placement  to sit in the chair safely.  Patient left in chair with chair alarm set; nursing in room.  Patient will benefit from continued skilled therapy services during the remainder of her hospital stay and at the next recommended venue of care to address deficits and promote return to optimal function.         Recommendations for follow up therapy are one component of a multi-disciplinary discharge planning process, led by the attending physician.  Recommendations may be updated based on patient status, additional functional criteria and insurance authorization.  Follow Up Recommendations       Assistance Recommended at Discharge Intermittent Supervision/Assistance  Patient can return home with the following  A little help with walking and/or transfers;A little help with bathing/dressing/bathroom;Help with stairs or ramp for entrance;Assistance with cooking/housework    Equipment Recommendations None recommended by PT  Recommendations for Other Services       Functional Status Assessment Patient has had a recent decline in their functional status and demonstrates the ability to make significant improvements in function in a reasonable and predictable amount of time.     Precautions / Restrictions Precautions Precautions: Fall Restrictions Weight Bearing Restrictions: No      Mobility  Bed Mobility Overal bed mobility: Needs Assistance Bed Mobility: Supine to Sit     Supine to sit: Min guard     General bed mobility comments: supine to sit with HOB slightly elevated; takes extra time and effort; uses bed railing to assist to pulling up to sitting  Patient Response: Cooperative  Transfers Overall transfer level: Needs assistance Equipment used: Rolling walker (2 wheels) Transfers: Sit to/from Stand, Bed to chair/wheelchair/BSC Sit to Stand: Min assist   Step pivot transfers: Min assist       General transfer comment: needs min A for boost with sit to stand to RW and with sit  to stand from toilet    Ambulation/Gait Ambulation/Gait assistance: Min guard Gait Distance (Feet): 20 Feet Assistive device: Rolling walker (2 wheels) Gait Pattern/deviations: Trunk flexed, Wide base of support       General Gait Details: slower than normal gait speed  Stairs            Wheelchair Mobility    Modified Rankin (Stroke Patients Only)       Balance Overall balance assessment: Needs assistance Sitting-balance support: Bilateral upper extremity supported, Feet supported Sitting balance-Leahy Scale: Good Sitting balance - Comments: good sitting balance on edge of bed with feet and hands assisting to support; slight forward flexed trunk   Standing balance support: Bilateral upper extremity supported, Reliant on assistive device for balance, During functional activity Standing balance-Leahy Scale: Fair Standing balance comment: fair to good standing balance with RW and CGA from therapist for safety; needs decreased speed with ambulation and direction changes to control balance                             Pertinent Vitals/Pain Pain Assessment Pain Assessment: No/denies pain    Home Living Family/patient expects to be discharged to:: Private residence Living Arrangements: Alone Available Help at Discharge: Friend(s);Neighbor Type of Home: House Home Access: Stairs to enter Entrance Stairs-Rails: Left;Right;Can reach both Secretary/administrator of Steps: 4   Home Layout: One level Home Equipment: Shower seat - built in;Rollator (4 wheels);Cane - single point;Wheelchair - manual;Grab bars - tub/shower;Grab bars - toilet Additional Comments: uses AD prn at baseline    Prior Function Prior Level of Function : Independent/Modified Independent                     Hand Dominance   Dominant Hand: Right    Extremity/Trunk Assessment   Upper Extremity Assessment Upper Extremity Assessment: Generalized weakness    Lower Extremity  Assessment Lower Extremity Assessment: Generalized weakness    Cervical / Trunk Assessment Cervical / Trunk Assessment: Kyphotic  Communication   Communication: No difficulties  Cognition Arousal/Alertness: Awake/alert Behavior During Therapy: WFL for tasks assessed/performed Overall Cognitive Status: Within Functional Limits for tasks assessed                                 General Comments: initially lethargic but rouses easily        General Comments General comments (skin integrity, edema, etc.): redness bilateral lower legs and rash noted on buttocks; RN notifed    Exercises     Assessment/Plan    PT Assessment Patient needs continued PT services  PT Problem List Decreased strength;Decreased activity tolerance;Decreased balance;Decreased mobility       PT Treatment Interventions Balance training;Gait training;Patient/family education;Functional mobility training;Therapeutic activities;Therapeutic exercise    PT Goals (Current goals can be found in the Care Plan section)  Acute Rehab PT Goals Patient Stated Goal: return home PT Goal Formulation: With patient Time For Goal Achievement: 07/28/22 Potential to Achieve Goals: Good    Frequency Min 2X/week     Co-evaluation  AM-PAC PT "6 Clicks" Mobility  Outcome Measure Help needed turning from your back to your side while in a flat bed without using bedrails?: A Little Help needed moving from lying on your back to sitting on the side of a flat bed without using bedrails?: A Little Help needed moving to and from a bed to a chair (including a wheelchair)?: A Little Help needed standing up from a chair using your arms (e.g., wheelchair or bedside chair)?: A Little Help needed to walk in hospital room?: A Little Help needed climbing 3-5 steps with a railing? : A Lot 6 Click Score: 17    End of Session   Activity Tolerance: Patient tolerated treatment well Patient left: in  chair;with call bell/phone within reach;with chair alarm set;with nursing/sitter in room Nurse Communication: Mobility status PT Visit Diagnosis: Unsteadiness on feet (R26.81);Other abnormalities of gait and mobility (R26.89);Muscle weakness (generalized) (M62.81)    Time: 1191-4782 PT Time Calculation (min) (ACUTE ONLY): 19 min   Charges:   PT Evaluation $PT Eval Low Complexity: 1 Low          8:37 AM, 07/14/22 Tamyia Minich Small Jesson Foskey MPT [place] [facility] [place] [phone] Ph:[phone]

## 2022-07-14 NOTE — Assessment & Plan Note (Addendum)
-   Evidence of chronic venous stasis - Blistering with no weeping - There is also vascular congestion on chest x-ray - No documented history of CHF - Consider echo in the a.m. - One-time dose of Lasix - Continue to monitor

## 2022-07-14 NOTE — Care Management Obs Status (Signed)
MEDICARE OBSERVATION STATUS NOTIFICATION   Patient Details  Name: [name] MRN: [id number] Date of Birth: 18-Sep-1931   Medicare Observation Status Notification Given:  Yes    Villa Herb, LCSWA 07/14/2022, 10:28 AM

## 2022-07-14 NOTE — Assessment & Plan Note (Signed)
-   Continue metoprolol and diltiazem - Heart healthy/low-sodium diet discussed with patient.

## 2022-07-14 NOTE — Assessment & Plan Note (Signed)
-   Likely secondary to infection - Leukocytosis, UA indicative of UTI - 1 L of fluid given in the ED - Rocephin started, continue Rocephin - Urine culture pending - Patient is unfortunately not able to give any history but friends at bedside in the ED - The patient was confused and basically not thinking clearly like she usually does - Patient lives alone - Admitted for observation, mentation is likely to improve with treatment of infection - Possibly be discharged within 24 hours

## 2022-07-14 NOTE — Assessment & Plan Note (Signed)
-   UA indicative of UTI - Urine culture pending - Leukocytosis and altered mental status - Rocephin started in the ED, continue Rocephin - Continue to monitor

## 2022-07-14 NOTE — TOC Initial Note (Signed)
Transition of Care [facility]) - Initial/Assessment Note    Patient Details  Name: [name] MRN: [id number] Date of Birth: [birth year]  [facility]) CM/SW Contact:    Villa Herb, LCSWA Phone Number: 07/14/2022, 10:29 AM  Clinical Narrative:                 CSW updated that PT is recommending HH PT for pt at D/C. CSW spoke with pt who states she takes herself to all appointments and gets around independently in the community. CSW inquired about pts interest in [facility] PT being set up. Pt states she feels this is not needed and would not like a referral made at this time. Pt states that an [facility] RN lives across the street and assists pt if anything is needed. TOC to follow.   Expected Discharge Plan: Home/Self Care Barriers to Discharge: Continued Medical Work up   Patient Goals and CMS Choice Patient states their goals for this hospitalization and ongoing recovery are:: return home CMS Medicare.gov Compare Post Acute Care list provided to:: Patient Choice offered to / list presented to : Patient [place] ownership interest in [facility].provided to:: Patient    Expected Discharge Plan and Services In-house Referral: Clinical Social Work Discharge Planning Services: CM Consult   Living arrangements for the past 2 months: Single Family Home                                      Prior Living Arrangements/Services Living arrangements for the past 2 months: Single Family Home Lives with:: Self Patient language and need for interpreter reviewed:: Yes Do you feel safe going back to the place where you live?: Yes      Need for Family Participation in Patient Care: Yes (Comment) Care giver support system in place?: Yes (comment)   Criminal Activity/Legal Involvement Pertinent to Current Situation/Hospitalization: No - Comment as needed  Activities of Daily Living Home Assistive Devices/Equipment: Cane (specify quad or straight), Walker (specify  type) ADL Screening (condition at time of admission) Patient's cognitive ability adequate to safely complete daily activities?: No (has assistance from neighbor, nusre rick) Is the patient deaf or have difficulty hearing?: No Does the patient have difficulty seeing, even when wearing glasses/contacts?: No Does the patient have difficulty concentrating, remembering, or making decisions?: No Patient able to express need for assistance with ADLs?: No Does the patient have difficulty dressing or bathing?: No Independently performs ADLs?: No Communication: Independent Dressing (OT): Needs assistance Is this a change from baseline?: Pre-admission baseline Grooming: Needs assistance Is this a change from baseline?: Pre-admission baseline Feeding: Independent Bathing: Needs assistance Is this a change from baseline?: Pre-admission baseline Toileting: Needs assistance Is this a change from baseline?: Pre-admission baseline In/Out Bed: Needs assistance, Independent with device (comment) Walks in Home: Needs assistance Is this a change from baseline?: Pre-admission baseline Does the patient have difficulty walking or climbing stairs?: Yes Weakness of Legs: Both Weakness of Arms/Hands: None  Permission Sought/Granted                  Emotional Assessment Appearance:: Appears stated age Attitude/Demeanor/Rapport: Engaged Affect (typically observed): Accepting Orientation: : Oriented to Self, Oriented to Place, Oriented to Situation Alcohol / Substance Use: Not Applicable Psych Involvement: No (comment)  Admission diagnosis:  Acute cystitis with hematuria [N30.01] Acute metabolic encephalopathy [G93.41] Patient Active Problem List   Diagnosis Date  Noted   Essential hypertension 07/14/2022   Generalized weakness 07/14/2022   UTI (urinary tract infection) 07/14/2022   Peripheral edema 07/14/2022   Acute metabolic encephalopathy 07/13/2022   Bilateral hip joint arthritis 09/06/2013    Degeneration of intervertebral disc of lumbar region 09/06/2013   BACK PAIN 01/24/2009   CLOSED FRACTURE OF METATARSAL BONE 04/20/2007   PCP:  Assunta Found, MD Pharmacy:   [facility] [place], [place] - [address] [place] [address] Phone: [phone] Fax: [phone]     Social Determinants of Health (SDOH) Social History: SDOH Screenings   Food Insecurity: No Food Insecurity (07/13/2022)  Housing: Low Risk  (07/13/2022)  Transportation Needs: No Transportation Needs (07/13/2022)  Utilities: Not At Risk (07/13/2022)  Tobacco Use: Low Risk  (07/13/2022)   SDOH Interventions:     Readmission Risk Interventions     No data to display

## 2022-07-14 NOTE — Assessment & Plan Note (Signed)
-   Patient evaluated by physical therapy with recommendation for home health services at discharge. -Patient decline it.

## 2022-07-14 NOTE — Plan of Care (Signed)
  Problem: Acute Rehab PT Goals(only PT should resolve) Goal: Pt Will Go Supine/Side To Sit Outcome: Progressing Flowsheets (Taken 07/14/2022 0838) Pt will go Supine/Side to Sit: with supervision Goal: Patient Will Transfer Sit To/From Stand Outcome: Progressing Flowsheets (Taken 07/14/2022 [phone]) Patient will transfer sit to/from stand: with min guard assist Goal: Pt Will Transfer Bed To Chair/Chair To Bed Outcome: Progressing Flowsheets (Taken 07/14/2022 0838) Pt will Transfer Bed to Chair/Chair to Bed: min guard assist Goal: Pt Will Ambulate Outcome: Progressing Flowsheets (Taken 07/14/2022 0838) Pt will Ambulate:  50 feet  with min guard assist  with rolling walker

## 2022-07-15 DIAGNOSIS — R531 Weakness: Secondary | ICD-10-CM | POA: Diagnosis not present

## 2022-07-15 DIAGNOSIS — G9341 Metabolic encephalopathy: Secondary | ICD-10-CM | POA: Diagnosis not present

## 2022-07-15 DIAGNOSIS — N3000 Acute cystitis without hematuria: Secondary | ICD-10-CM | POA: Diagnosis not present

## 2022-07-15 DIAGNOSIS — R600 Localized edema: Secondary | ICD-10-CM | POA: Diagnosis not present

## 2022-07-15 LAB — URINE CULTURE: Culture: NO GROWTH

## 2022-07-15 MED ORDER — ASPIRIN 81 MG PO TBEC: 81.0000 mg | DELAYED_RELEASE_TABLET | Freq: Every day | ORAL | 12 refills | Status: DC

## 2022-07-15 MED ORDER — METOPROLOL TARTRATE 100 MG PO TABS: 100.0000 mg | ORAL_TABLET | Freq: Two times a day (BID) | ORAL | 1 refills | Status: AC

## 2022-07-15 MED ORDER — DILTIAZEM HCL ER COATED BEADS 180 MG PO CP24: 180.0000 mg | ORAL_CAPSULE | Freq: Every day | ORAL | 1 refills | Status: AC

## 2022-07-15 MED ORDER — CEFDINIR 300 MG PO CAPS: 300.0000 mg | ORAL_CAPSULE | Freq: Two times a day (BID) | ORAL | 0 refills | Status: AC

## 2022-07-15 NOTE — Progress Notes (Signed)
   07/15/22 0900  ReDS Vest / Clip  BMI (Calculated) 33.02  Station Marker B  Ruler Value 38  ReDS Value Range < 36  ReDS Actual Value 17

## 2022-07-15 NOTE — Progress Notes (Signed)
Mobility Specialist Progress Note:    07/15/22 1018  Mobility  Activity Ambulated with assistance in hallway  Level of Assistance Contact guard assist, steadying assist  Assistive Device Front wheel walker  Distance Ambulated (ft) 30 ft  Range of Motion/Exercises Active  Activity Response Tolerated well  Mobility Referral Yes  $Mobility charge 1 Mobility  Mobility Specialist Start Time (ACUTE ONLY) 1000  Mobility Specialist Stop Time (ACUTE ONLY) 1015  Mobility Specialist Time Calculation (min) (ACUTE ONLY) 15 min   Pt agreeable to mobility session. Tolerated well, asx throughout. Pt reports they ambulate with cane at home, was able to ambulate in hallway with hospital RW for safety. Returned pt to chair in room, all needs met, call bell and phone in reach.   Feliciana Rossetti Mobility Specialist Please contact via Special educational needs teacher or  Rehab office at [phone]

## 2022-07-15 NOTE — Discharge Summary (Signed)
Physician Discharge Summary   Patient: [name] MRN: [id number] DOB: May 22, 1931  Admit date:     07/13/2022  Discharge date: 07/15/22  Discharge Physician: Vassie Loll   PCP: Assunta Found, MD   Recommendations at discharge:  Repeat CBC to follow hemoglobin trend and stability. Repeat basic metabolic panel to follow electrolytes and renal function Reassess blood pressure and adjust antihypertensive regimen as needed Reassess patient's safety regarding ability to continue living independently and performing her activities of daily living without assistance. Goals of care discussion and advance care planning recommended. Assess ongoing lower extremity peripheral edema and determine the need for referral to physical therapy/wound care service for Unna boots management. Follow final urine culture results and adjust/exchange antibiotics if needed.  Discharge Diagnoses: Principal Problem:   Acute metabolic encephalopathy Active Problems:   Essential hypertension   Generalized weakness   UTI (urinary tract infection)   Peripheral edema  Brief Hospital admission narrative: [name] is a 89 y.o. female with medical history significant of osteoarthritis, hypertension, presents the ED with friends at bedside complaining of altered mental status and weakness.  Friends reported the patient has been more confused than normal.  She is also had some generalized weakness.  Somebody reported foul-smelling order to the ED provider.  UA was indicative of UTI.  Urine culture pending.  Patient was started on Rocephin.  Unfortunately, patient is not able to provide any history at this time.  She will not open her eyes for examination.  She answers " mmhmm" to every question, but will not actually speak.  My impression is that this will improve in the morning, with treatment infection and after a night of rest.  No further history could be obtained at this time.   Assessment and Plan: * Acute  metabolic encephalopathy - Likely secondary to UTI -Patient with excellent response to fluid resuscitation and antibiotics -At discharge mentation improved and back to baseline -Patient declining home health services and wanting to follow-up with her PCP for further recommendations. -Patient antibiotics successfully transition to oral cefdinir to complete therapy -Patient advised to maintain adequate nutrition and hydration.  Peripheral edema - Evidence of chronic venous stasis - Blistering with no weeping - Leg elevation as much as possible, low-sodium diet and outpatient follow-up with PCP recommended. -Patient might be a candidate for Unna boots.  UTI (urinary tract infection) - UA indicative of UTI - Follow final culture results and adjust/exchange antibiotic therapy if needed. -Patient instructed to maintain adequate hydration.  Generalized weakness - Patient evaluated by physical therapy with recommendation for home health services at discharge. -Patient decline it.  Essential hypertension - Continue metoprolol and diltiazem - Heart healthy/low-sodium diet discussed with patient.   Consultants: None Procedures performed: See below for x-ray reports. Disposition: Home Diet recommendation: Heart healthy/low-sodium diet.  DISCHARGE MEDICATION: Allergies as of 07/15/2022   No Known Allergies      Medication List     TAKE these medications    aspirin EC 81 MG tablet Take 1 tablet (81 mg total) by mouth daily. Swallow whole. Start taking on: Jul 16, 2022   cefdinir 300 MG capsule Commonly known as: OMNICEF Take 1 capsule (300 mg total) by mouth 2 (two) times daily for 6 days.   diltiazem 180 MG 24 hr capsule Commonly known as: CARDIZEM CD Take 1 capsule (180 mg total) by mouth daily. Start taking on: Jul 16, 2022   metoprolol tartrate 100 MG tablet Commonly known as: LOPRESSOR Take 1 tablet (  100 mg total) by mouth 2 (two) times daily.        Follow-up  Information     Assunta Found, MD. Schedule an appointment as soon as possible for a visit in 1 week(s).   Specialty: Family Medicine Contact information: [address] [place] [address] [phone]                Discharge Exam: Ceasar Mons Weights   07/13/22 1732 07/13/22 2100 07/15/22 0900  Weight: 88.5 kg 90.2 kg 87.3 kg   General exam: Alert, awake, oriented x 3; no chest pain, no nausea, no vomiting.  Wants to go home. Respiratory system: Clear to auscultation. Respiratory effort normal.  Good saturation on room air. Cardiovascular system:RRR. No murmurs, rubs, gallops.  No JVD. Gastrointestinal system: Abdomen is nondistended, soft and nontender. No organomegaly or masses felt. Normal bowel sounds heard. Central nervous system: Alert and oriented. No focal neurological deficits. Extremities/skin: No cyanosis or clubbing; bilateral lower extremity stasis dermatitis without signs of superimposed infection or active drainage. Psychiatry: Judgement and insight appear normal. Mood & affect appropriate.    Condition at discharge: Stable and improved.  The results of significant diagnostics from this hospitalization (including imaging, microbiology, ancillary and laboratory) are listed below for reference.   Imaging Studies: DG Chest Port 1 View  Result Date: 07/13/2022 CLINICAL DATA:  Weakness EXAM: PORTABLE CHEST 1 VIEW COMPARISON:  01/16/2005 FINDINGS: Cardiac shadow is enlarged but stable. Lungs are well aerated bilaterally. Mild central vascular congestion is noted. Left basilar atelectasis is seen. No bony abnormality is noted. IMPRESSION: Mild left basilar atelectasis and vascular congestion. Electronically Signed   By: Alcide Clever M.D.   On: 07/13/2022 19:01    Microbiology: Results for orders placed or performed during the hospital encounter of 07/13/22  Urine Culture     Status: None   Collection Time: 07/13/22  7:25 PM   Specimen: Urine, Clean Catch   Result Value Ref Range Status   Specimen Description   Final    URINE, CLEAN CATCH Performed at [facility], [address] [place], [place] [address]    Special Requests   Final    NONE Performed at [facility], [address] [place], [place] [address]    Culture   Final    NO GROWTH Performed at [facility], [address], [place], [place] [address]    Report Status 07/15/2022 FINAL  Final    Labs: CBC: Recent Labs  Lab 07/13/22 1810 07/14/22 0445  WBC 16.3* 10.5  NEUTROABS 14.2* 8.3*  HGB 13.3 11.8*  HCT 39.7 36.0  MCV 92.5 94.5  PLT 187 178   Basic Metabolic Panel: Recent Labs  Lab 07/13/22 1810 07/14/22 0445  NA 132* 134*  K 3.7 3.1*  CL 99 102  CO2 24 23  GLUCOSE 106* 92  BUN 15 14  CREATININE 0.98 0.95  CALCIUM 8.5* 8.1*  MG  --  1.9   Liver Function Tests: Recent Labs  Lab 07/13/22 1810 07/14/22 0445  AST 16 15  ALT 13 12  ALKPHOS 51 43  BILITOT 1.4* 1.0  PROT 6.7 5.6*  ALBUMIN 3.7 3.1*   CBG: No results for input(s): "GLUCAP" in the last 168 hours.  Discharge time spent: greater than 30 minutes.  Signed: Vassie Loll, MD [facility] 07/15/2022

## 2022-07-23 DIAGNOSIS — R600 Localized edema: Secondary | ICD-10-CM | POA: Diagnosis not present

## 2022-07-23 DIAGNOSIS — E876 Hypokalemia: Secondary | ICD-10-CM | POA: Diagnosis not present

## 2022-07-23 DIAGNOSIS — N390 Urinary tract infection, site not specified: Secondary | ICD-10-CM | POA: Diagnosis not present

## 2022-07-23 DIAGNOSIS — I10 Essential (primary) hypertension: Secondary | ICD-10-CM | POA: Diagnosis not present

## 2022-07-23 DIAGNOSIS — E6609 Other obesity due to excess calories: Secondary | ICD-10-CM | POA: Diagnosis not present

## 2022-07-23 DIAGNOSIS — Z6834 Body mass index (BMI) 34.0-34.9, adult: Secondary | ICD-10-CM | POA: Diagnosis not present

## 2023-03-27 DIAGNOSIS — R059 Cough, unspecified: Secondary | ICD-10-CM | POA: Diagnosis not present

## 2023-03-27 DIAGNOSIS — J069 Acute upper respiratory infection, unspecified: Secondary | ICD-10-CM | POA: Diagnosis not present

## 2023-03-27 DIAGNOSIS — R509 Fever, unspecified: Secondary | ICD-10-CM | POA: Diagnosis not present

## 2023-03-28 DIAGNOSIS — R748 Abnormal levels of other serum enzymes: Secondary | ICD-10-CM | POA: Diagnosis not present

## 2023-03-28 DIAGNOSIS — Z7982 Long term (current) use of aspirin: Secondary | ICD-10-CM | POA: Diagnosis not present

## 2023-03-28 DIAGNOSIS — Z90710 Acquired absence of both cervix and uterus: Secondary | ICD-10-CM | POA: Diagnosis not present

## 2023-03-28 DIAGNOSIS — R4182 Altered mental status, unspecified: Secondary | ICD-10-CM | POA: Diagnosis not present

## 2023-03-28 DIAGNOSIS — S06330A Contusion and laceration of cerebrum, unspecified, without loss of consciousness, initial encounter: Secondary | ICD-10-CM | POA: Diagnosis not present

## 2023-03-28 DIAGNOSIS — I10 Essential (primary) hypertension: Secondary | ICD-10-CM | POA: Diagnosis not present

## 2023-03-28 DIAGNOSIS — F0390 Unspecified dementia without behavioral disturbance: Secondary | ICD-10-CM | POA: Diagnosis not present

## 2023-03-28 DIAGNOSIS — I6381 Other cerebral infarction due to occlusion or stenosis of small artery: Secondary | ICD-10-CM | POA: Diagnosis not present

## 2023-03-28 DIAGNOSIS — N390 Urinary tract infection, site not specified: Secondary | ICD-10-CM | POA: Diagnosis not present

## 2023-03-28 DIAGNOSIS — N3001 Acute cystitis with hematuria: Secondary | ICD-10-CM | POA: Diagnosis not present

## 2023-03-28 DIAGNOSIS — R531 Weakness: Secondary | ICD-10-CM | POA: Diagnosis not present

## 2023-03-28 DIAGNOSIS — R410 Disorientation, unspecified: Secondary | ICD-10-CM | POA: Diagnosis not present

## 2023-03-28 DIAGNOSIS — Z8679 Personal history of other diseases of the circulatory system: Secondary | ICD-10-CM | POA: Diagnosis not present

## 2023-03-28 DIAGNOSIS — Z792 Long term (current) use of antibiotics: Secondary | ICD-10-CM | POA: Diagnosis not present

## 2023-03-28 DIAGNOSIS — Z79899 Other long term (current) drug therapy: Secondary | ICD-10-CM | POA: Diagnosis not present

## 2023-03-28 DIAGNOSIS — R5381 Other malaise: Secondary | ICD-10-CM | POA: Diagnosis not present

## 2023-03-28 DIAGNOSIS — Z8673 Personal history of transient ischemic attack (TIA), and cerebral infarction without residual deficits: Secondary | ICD-10-CM | POA: Diagnosis not present

## 2023-03-28 DIAGNOSIS — M6282 Rhabdomyolysis: Secondary | ICD-10-CM | POA: Diagnosis not present

## 2023-03-28 DIAGNOSIS — E860 Dehydration: Secondary | ICD-10-CM | POA: Diagnosis not present

## 2023-03-28 DIAGNOSIS — N179 Acute kidney failure, unspecified: Secondary | ICD-10-CM | POA: Diagnosis not present

## 2023-04-14 DIAGNOSIS — N390 Urinary tract infection, site not specified: Secondary | ICD-10-CM | POA: Diagnosis not present

## 2023-04-14 DIAGNOSIS — I10 Essential (primary) hypertension: Secondary | ICD-10-CM | POA: Diagnosis not present

## 2023-04-14 DIAGNOSIS — R350 Frequency of micturition: Secondary | ICD-10-CM | POA: Diagnosis not present

## 2023-04-14 DIAGNOSIS — Z6832 Body mass index (BMI) 32.0-32.9, adult: Secondary | ICD-10-CM | POA: Diagnosis not present

## 2023-04-14 DIAGNOSIS — E6609 Other obesity due to excess calories: Secondary | ICD-10-CM | POA: Diagnosis not present

## 2023-04-14 DIAGNOSIS — E876 Hypokalemia: Secondary | ICD-10-CM | POA: Diagnosis not present

## 2023-08-06 DIAGNOSIS — E6609 Other obesity due to excess calories: Secondary | ICD-10-CM | POA: Diagnosis not present

## 2023-08-06 DIAGNOSIS — Z6833 Body mass index (BMI) 33.0-33.9, adult: Secondary | ICD-10-CM | POA: Diagnosis not present

## 2023-08-06 DIAGNOSIS — A46 Erysipelas: Secondary | ICD-10-CM | POA: Diagnosis not present

## 2023-08-07 DIAGNOSIS — A46 Erysipelas: Secondary | ICD-10-CM | POA: Diagnosis not present

## 2023-08-08 ENCOUNTER — Other Ambulatory Visit: Payer: Self-pay

## 2023-08-08 ENCOUNTER — Ambulatory Visit (HOSPITAL_COMMUNITY): Attending: Family Medicine | Admitting: Physical Therapy

## 2023-08-08 DIAGNOSIS — R600 Localized edema: Secondary | ICD-10-CM | POA: Insufficient documentation

## 2023-08-08 DIAGNOSIS — S81801A Unspecified open wound, right lower leg, initial encounter: Secondary | ICD-10-CM | POA: Insufficient documentation

## 2023-08-08 NOTE — Therapy (Signed)
 OUTPATIENT PHYSICAL THERAPY wound EVALUATION   Patient Name: [name] MRN: [id number] DOB:1932-01-03, 89 y.o., female Today's Date: 08/08/2023   PCP: Minus Amel, MD REFERRING PROVIDER: Minus Amel, MD  END OF SESSION:  PT End of Session - 08/08/23 1536     Visit Number 1    Number of Visits 12    Date for PT Re-Evaluation 09/19/23    Authorization Type [employer]    Progress Note Due on Visit 10    PT Start Time 1435    PT Stop Time 1520    PT Time Calculation (min) 45 min    Activity Tolerance Patient tolerated treatment well    Behavior During Therapy [facility] for tasks assessed/performed             Past Medical History:  Diagnosis Date   Bilateral hip joint arthritis 09/06/2013   Degeneration of intervertebral disc of lumbar region 09/06/2013   Hypertension    Past Surgical History:  Procedure Laterality Date   ABDOMINAL HYSTERECTOMY     APPENDECTOMY     BREAST BIOPSY     CATARACT EXTRACTION W/PHACO Right 09/03/2012   Procedure: CATARACT EXTRACTION PHACO AND INTRAOCULAR LENS PLACEMENT (IOC);  Surgeon: Anner Kill, MD;  Location: [facility];  Service: Ophthalmology;  Laterality: Right;  CDE: 12.37   CATARACT EXTRACTION W/PHACO Left 09/28/2012   Procedure: CATARACT EXTRACTION PHACO AND INTRAOCULAR LENS PLACEMENT (IOC);  Surgeon: Anner Kill, MD;  Location: [facility];  Service: Ophthalmology;  Laterality: Left;  CDE 18.52   closed redution external fixation right wrist Right    INSERTION OF MESH N/A 06/30/2015   Procedure: INSERTION OF MESH;  Surgeon: Alanda Allegra, MD;  Location: [facility];  Service: General;  Laterality: N/A;   JOINT REPLACEMENT     bilateral Hips at [facility] FUSION     Dr. Joanette Moynahan   TONSILLECTOMY     UMBILICAL HERNIA REPAIR N/A 06/30/2015   Procedure: UMBILICAL HERNIORRHAPHY WITH MESH;  Surgeon: Alanda Allegra, MD;  Location: [facility];  Service: General;  Laterality: N/A;   Patient Active Problem List   Diagnosis Date Noted   Essential  hypertension 07/14/2022   Generalized weakness 07/14/2022   UTI (urinary tract infection) 07/14/2022   Peripheral edema 07/14/2022   Acute metabolic encephalopathy 07/13/2022   Bilateral hip joint arthritis 09/06/2013   Degeneration of intervertebral disc of lumbar region 09/06/2013   Backache 01/24/2009   Closed fracture of metatarsal bone 04/20/2007    ONSET DATE: 07/08/23 approximate  REFERRING DIAG: A46 (ICD-10-CM) - Erysipelas  THERAPY DIAG: edema, non healing wounds of right leg   Rationale for Evaluation and Treatment: Rehabilitation     Wound Therapy - 08/08/23 0001     Subjective PT states that she has had swelling in her legs for over a month.  Prior to the increased edema she was wearing compression garments everyday.  She states that the blisters came up on her right leg several weeks ago    Patient and Family Stated Goals decreased swelling and no wounds    Date of Onset 07/08/23   approximate as pt states that it snuck up on her.   Prior Treatments self care    Pain Scale 0-10    Pain Score 0-No pain    Evaluation and Treatment Procedures Explained to Patient/Family Yes    Evaluation and Treatment Procedures agreed to    Wound Properties Date First Assessed: 08/08/23 Time First Assessed: 1445 Wound Type: Other (Comment) ,  most likely from lymphedema  Location: Leg Location Orientation: Right Present on Admission: Yes   Wound Image Images linked: 1    Dressing Type None    Dressing Change Frequency PRN    Site / Wound Assessment Yellow    % Wound base Red or Granulating 0%    % Wound base Yellow/Fibrinous Exudate 100%    Wound Length (cm) --   multiple wounds on lateral, anterior and medial aspect of LE   Drainage Amount Minimal    Drainage Description Serous    Treatment Cleansed;Debridement (Selective)    Selective Debridement (non-excisional) - Location wound beds    Selective Debridement (non-excisional) - Tools Used Forceps;Scalpel    Selective Debridement  (non-excisional) - Tissue Removed slough    Wound Therapy - Clinical Statement see below    Wound Therapy - Functional Problem List difficulty in dressing and bathing    Factors Delaying/Impairing Wound Healing Vascular compromise;Immobility    Hydrotherapy Plan Debridement;Dressing change;Patient/family education    Wound Therapy - Frequency 2X / week   for 6  weeks   Wound Therapy - Current Recommendations PT    Wound Plan continue with debridement and dressing change.    Dressing  xeroform to wounds on Rt followed by profore compression dressing    Dressing Lt profore compression dressing.            Hx: [name] is a 89 y.o. female with medical history significant of osteoarthritis, hypertension   PATIENT EDUCATION: Education details: If dressing is uncomfortable take the brown outer bandage off and assess how it feels, keep bandaging dry Person educated: Patient Education method: Explanation Education comprehension: verbalized understanding   HOME EXERCISE PROGRAM: Ankle pumps   GOALS: Goals reviewed with patient? No  SHORT TERM GOALS: Target date: 08/29/23  Pt to have 3 or less wounds on right lower extremity  Baseline: Goal status: INITIAL  2.  PT LE to no longer be red  Baseline:  Goal status: INITIAL   LONG TERM GOALS: Target date: 09/19/23  Pt to no longer have any wounds on her leg Baseline:  Goal status: INITIAL  2.  PT to be able to fit back into her compression garments Baseline:  Goal status: INITIAL     ASSESSMENT:  CLINICAL IMPRESSION: Patient is a 89 y.o. female who was seen today for physical therapy evaluation and treatment for edema of both LE and non healing wounds on pt Rt LE. PT had been wearing compression garments daily until her legs began to swell.  She is unsure what caused her legs to swell.  She now has multiple wounds on her Rt LE which she can not heal.  Ms. Scurlock will benefit from skilled PT for cleansing,  debridement and compression bandaging to decrease her edema to reduce the risk of cellulitis.    OBJECTIVE IMPAIRMENTS: increased edema and decreased skin integrity .   ACTIVITY LIMITATIONS: bathing, dressing, and hygiene/grooming  PERSONAL FACTORS: Age and Fitness are also affecting patient's functional outcome.   REHAB POTENTIAL: Good  CLINICAL DECISION MAKING: Evolving/moderate complexity  EVALUATION COMPLEXITY: Moderate  PLAN: PT FREQUENCY: 2x/week  PT DURATION: 6 weeks  PLANNED INTERVENTIONS: 97110-Therapeutic exercises, 97535- Self Care, 78469- Manual therapy, and 97597- Wound care (first 20 sq cm)  PLAN FOR NEXT SESSION: assess comfort of compression dressing, continue with debridement, manual if needed and compression dressing.   Leodis Rainwater, PT CLT [phone]  08/08/2023, 4:59 PM

## 2023-08-12 ENCOUNTER — Ambulatory Visit (HOSPITAL_COMMUNITY): Admitting: Physical Therapy

## 2023-08-12 DIAGNOSIS — R600 Localized edema: Secondary | ICD-10-CM | POA: Diagnosis not present

## 2023-08-12 DIAGNOSIS — S81801A Unspecified open wound, right lower leg, initial encounter: Secondary | ICD-10-CM

## 2023-08-12 NOTE — Therapy (Signed)
 OUTPATIENT PHYSICAL THERAPY Wound Treatment   Patient Name: [name] MRN: [id number] DOB:08/02/1931, 89 y.o., female Today's Date: 08/12/2023   PCP: Minus Amel, MD REFERRING PROVIDER: Minus Amel, MD  END OF SESSION:  PT End of Session - 08/12/23 1209     Visit Number 2    Number of Visits 12    Date for PT Re-Evaluation 09/19/23    Authorization Type [employer]    Progress Note Due on Visit 10    PT Start Time 1110    PT Stop Time 1205    PT Time Calculation (min) 55 min    Activity Tolerance Patient tolerated treatment well    Behavior During Therapy [facility] for tasks assessed/performed             Past Medical History:  Diagnosis Date   Bilateral hip joint arthritis 09/06/2013   Degeneration of intervertebral disc of lumbar region 09/06/2013   Hypertension    Past Surgical History:  Procedure Laterality Date   ABDOMINAL HYSTERECTOMY     APPENDECTOMY     BREAST BIOPSY     CATARACT EXTRACTION W/PHACO Right 09/03/2012   Procedure: CATARACT EXTRACTION PHACO AND INTRAOCULAR LENS PLACEMENT (IOC);  Surgeon: Anner Kill, MD;  Location: [facility];  Service: Ophthalmology;  Laterality: Right;  CDE: 12.37   CATARACT EXTRACTION W/PHACO Left 09/28/2012   Procedure: CATARACT EXTRACTION PHACO AND INTRAOCULAR LENS PLACEMENT (IOC);  Surgeon: Anner Kill, MD;  Location: [facility];  Service: Ophthalmology;  Laterality: Left;  CDE 18.52   closed redution external fixation right wrist Right    INSERTION OF MESH N/A 06/30/2015   Procedure: INSERTION OF MESH;  Surgeon: Alanda Allegra, MD;  Location: [facility];  Service: General;  Laterality: N/A;   JOINT REPLACEMENT     bilateral Hips at [facility] FUSION     Dr. Joanette Moynahan   TONSILLECTOMY     UMBILICAL HERNIA REPAIR N/A 06/30/2015   Procedure: UMBILICAL HERNIORRHAPHY WITH MESH;  Surgeon: Alanda Allegra, MD;  Location: [facility];  Service: General;  Laterality: N/A;   Patient Active Problem List   Diagnosis Date Noted   Essential  hypertension 07/14/2022   Generalized weakness 07/14/2022   UTI (urinary tract infection) 07/14/2022   Peripheral edema 07/14/2022   Acute metabolic encephalopathy 07/13/2022   Bilateral hip joint arthritis 09/06/2013   Degeneration of intervertebral disc of lumbar region 09/06/2013   Backache 01/24/2009   Closed fracture of metatarsal bone 04/20/2007    ONSET DATE: 07/08/23 approximate  REFERRING DIAG: A46 (ICD-10-CM) - Erysipelas  THERAPY DIAG: edema, non healing wounds of right leg   Rationale for Evaluation and Treatment: Rehabilitation     Wound Therapy - 08/12/23 0001     Subjective PT states that her legs feel better    Patient and Family Stated Goals for her wounds to heal    Date of Onset 07/08/23   approximate as pt states that it snuck up on her.   Prior Treatments self care    Evaluation and Treatment Procedures Explained to Patient/Family Yes    Evaluation and Treatment Procedures agreed to    Wound Properties Date First Assessed: 08/08/23 Time First Assessed: 1445 Wound Type: Other (Comment) , most likely from lymphedema  Location: Leg Location Orientation: Right Present on Admission: Yes   Dressing Type None    Dressing Change Frequency PRN    Site / Wound Assessment Yellow    % Wound base Red or Granulating 50%    %  Wound base Yellow/Fibrinous Exudate 50%    Wound Length (cm) --   PT now has 7 wounds located from lateral to medial aspect of LE   Drainage Amount Minimal    Drainage Description Serous    Treatment Cleansed;Debridement (Selective);Other (Comment)   manual to decrease edema.   Selective Debridement (non-excisional) - Location wound beds    Selective Debridement (non-excisional) - Tools Used Forceps;Scalpel    Selective Debridement (non-excisional) - Tissue Removed slough    Wound Therapy - Clinical Statement see below    Wound Therapy - Functional Problem List difficulty in dressing and bathing    Factors Delaying/Impairing Wound Healing Vascular  compromise;Immobility    Hydrotherapy Plan Debridement;Dressing change;Patient/family education    Wound Therapy - Frequency 2X / week   for 6  weeks   Wound Therapy - Current Recommendations PT    Wound Plan continue with debridement and dressing change.    Dressing  silver hydrofiber to wounds on Rt followed by profore litecompression dressing    Dressing Lt profore litecompression dressing.    Manual Therapy manual to both LE to promote decongestion of LE            Hx: [name] is a 89 y.o. female with medical history significant of osteoarthritis, hypertension   PATIENT EDUCATION: Education details: If dressing is uncomfortable take the brown outer bandage off and assess how it feels, keep bandaging dry Person educated: Patient Education method: Explanation Education comprehension: verbalized understanding   HOME EXERCISE PROGRAM: Ankle pumps   GOALS: Goals reviewed with patient? No  SHORT TERM GOALS: Target date: 08/29/23  Pt to have 3 or less wounds on right lower extremity  Baseline: Goal status: on-going   2.  PT LE to no longer be red  Baseline:  Goal status: IN PROGRESS   LONG TERM GOALS: Target date: 09/19/23  Pt to no longer have any wounds on her leg Baseline:  Goal status: IN PROGRESS  2.  PT to be able to fit back into her compression garments Baseline:  Goal status: IN PROGRESS     ASSESSMENT:  CLINICAL IMPRESSION: Patient  seen today for physical therapy  treatment for edema of both LE and non healing wounds on pt Rt LE.  Edema has decreased significantly from last week therefore therapist changed dressing to profore lite.  Pt wounds have decreased in number on Rt LE.   Ms. Melena will continue to benefit from skilled PT for cleansing, debridement and compression bandaging to decrease her edema to reduce the risk of cellulitis.    OBJECTIVE IMPAIRMENTS: increased edema and decreased skin integrity .   ACTIVITY LIMITATIONS:  bathing, dressing, and hygiene/grooming  PERSONAL FACTORS: Age and Fitness are also affecting patient's functional outcome.   REHAB POTENTIAL: Good  CLINICAL DECISION MAKING: Evolving/moderate complexity  EVALUATION COMPLEXITY: Moderate  PLAN: PT FREQUENCY: 2x/week  PT DURATION: 6 weeks  PLANNED INTERVENTIONS: 97110-Therapeutic exercises, 97535- Self Care, 82956- Manual therapy, and 97597- Wound care (first 20 sq cm)  PLAN FOR NEXT SESSION: continue with debridement, manual and compression dressing.   Leodis Rainwater, PT CLT [phone]  08/12/2023, 12:18 PM

## 2023-08-15 ENCOUNTER — Ambulatory Visit (HOSPITAL_COMMUNITY): Admitting: Physical Therapy

## 2023-08-15 DIAGNOSIS — S81801A Unspecified open wound, right lower leg, initial encounter: Secondary | ICD-10-CM

## 2023-08-15 DIAGNOSIS — R600 Localized edema: Secondary | ICD-10-CM | POA: Diagnosis not present

## 2023-08-15 NOTE — Therapy (Signed)
 OUTPATIENT PHYSICAL THERAPY Wound Treatment   Patient Name: [name] MRN: [id number] DOB:12-Nov-1931, 89 y.o., female Today's Date: 08/15/2023   PCP: Minus Amel, MD REFERRING PROVIDER: Minus Amel, MD  END OF SESSION:  PT End of Session - 08/15/23 1706     Visit Number 3    Number of Visits 12    Date for PT Re-Evaluation 09/19/23    Authorization Type [employer]    Progress Note Due on Visit 10    PT Start Time 1445    PT Stop Time 1540    PT Time Calculation (min) 55 min    Activity Tolerance Patient tolerated treatment well    Behavior During Therapy [facility] for tasks assessed/performed          Past Medical History:  Diagnosis Date   Bilateral hip joint arthritis 09/06/2013   Degeneration of intervertebral disc of lumbar region 09/06/2013   Hypertension    Past Surgical History:  Procedure Laterality Date   ABDOMINAL HYSTERECTOMY     APPENDECTOMY     BREAST BIOPSY     CATARACT EXTRACTION W/PHACO Right 09/03/2012   Procedure: CATARACT EXTRACTION PHACO AND INTRAOCULAR LENS PLACEMENT (IOC);  Surgeon: Anner Kill, MD;  Location: [facility];  Service: Ophthalmology;  Laterality: Right;  CDE: 12.37   CATARACT EXTRACTION W/PHACO Left 09/28/2012   Procedure: CATARACT EXTRACTION PHACO AND INTRAOCULAR LENS PLACEMENT (IOC);  Surgeon: Anner Kill, MD;  Location: [facility];  Service: Ophthalmology;  Laterality: Left;  CDE 18.52   closed redution external fixation right wrist Right    INSERTION OF MESH N/A 06/30/2015   Procedure: INSERTION OF MESH;  Surgeon: Alanda Allegra, MD;  Location: [facility];  Service: General;  Laterality: N/A;   JOINT REPLACEMENT     bilateral Hips at [facility] FUSION     Dr. Joanette Moynahan   TONSILLECTOMY     UMBILICAL HERNIA REPAIR N/A 06/30/2015   Procedure: UMBILICAL HERNIORRHAPHY WITH MESH;  Surgeon: Alanda Allegra, MD;  Location: [facility];  Service: General;  Laterality: N/A;   Patient Active Problem List   Diagnosis Date Noted   Essential  hypertension 07/14/2022   Generalized weakness 07/14/2022   UTI (urinary tract infection) 07/14/2022   Peripheral edema 07/14/2022   Acute metabolic encephalopathy 07/13/2022   Bilateral hip joint arthritis 09/06/2013   Degeneration of intervertebral disc of lumbar region 09/06/2013   Backache 01/24/2009   Closed fracture of metatarsal bone 04/20/2007    ONSET DATE: 07/08/23 approximate  REFERRING DIAG: A46 (ICD-10-CM) - Erysipelas  THERAPY DIAG: edema, non healing wounds of right leg   Rationale for Evaluation and Treatment: Rehabilitation     Wound Therapy - 08/15/23 0001     Subjective Pt states that her legs feel better    Patient and Family Stated Goals for her wounds to heal    Date of Onset 07/08/23   approximate as pt states that it snuck up on her.   Prior Treatments self care    Pain Scale 0-10    Pain Score 3    with debridement   Evaluation and Treatment Procedures Explained to Patient/Family Yes    Evaluation and Treatment Procedures agreed to    Wound Properties Date First Assessed: 08/08/23 Time First Assessed: 1445 Location: Leg Location Orientation: Right DO NOT USE: Wound Type: Other (Comment) , most likely from lymphedema  DO NOT USE:  Present on Admission: Yes   Dressing Type None    Dressing Change Frequency PRN  Site / Wound Assessment Yellow    % Wound base Red or Granulating 50%    % Wound base Yellow/Fibrinous Exudate 50%    Wound Length (cm) --   Pt down to 4 wounds was over 10   Drainage Amount Minimal    Drainage Description Serous    Treatment Cleansed;Debridement (Selective)    Selective Debridement (non-excisional) - Location wound beds    Selective Debridement (non-excisional) - Tools Used Forceps;Scalpel    Selective Debridement (non-excisional) - Tissue Removed slough    Wound Therapy - Clinical Statement see below    Wound Therapy - Functional Problem List difficulty in dressing and bathing    Factors Delaying/Impairing Wound Healing  Vascular compromise;Immobility    Hydrotherapy Plan Debridement;Dressing change;Patient/family education    Wound Therapy - Frequency 2X / week   for 6  weeks   Wound Therapy - Current Recommendations PT    Wound Plan continue with debridement and dressing change.    Dressing  silver hydrofiber to wounds on Rt followed by profore litecompression dressing    Dressing Lt profore litecompression dressing.    Manual Therapy manual to both LE to promote decongestion of LE         Hx: [name] is a 89 y.o. female with medical history significant of osteoarthritis, hypertension   PATIENT EDUCATION: Education details: If dressing is uncomfortable take the brown outer bandage off and assess how it feels, keep bandaging dry Person educated: Patient Education method: Explanation Education comprehension: verbalized understanding   HOME EXERCISE PROGRAM: Ankle pumps   GOALS: Goals reviewed with patient? No  SHORT TERM GOALS: Target date: 08/29/23  Pt to have 3 or less wounds on right lower extremity  Baseline: Goal status: on-going   2.  PT LE to no longer be red  Baseline:  Goal status: IN PROGRESS   LONG TERM GOALS: Target date: 09/19/23  Pt to no longer have any wounds on her leg Baseline:  Goal status: IN PROGRESS  2.  PT to be able to fit back into her compression garments Baseline:  Goal status: IN PROGRESS     ASSESSMENT:  CLINICAL IMPRESSION: Pt LE edema continues to decrease with decreased wound on Rt LE.  Wounds debrided as they have yellow adherent slough in the wound beds.     [name] will continue to benefit from skilled PT for cleansing, debridement and compression bandaging to decrease her edema to reduce the risk of cellulitis.    OBJECTIVE IMPAIRMENTS: increased edema and decreased skin integrity .   ACTIVITY LIMITATIONS: bathing, dressing, and hygiene/grooming  PERSONAL FACTORS: Age and Fitness are also affecting patient's functional  outcome.   REHAB POTENTIAL: Good  CLINICAL DECISION MAKING: Evolving/moderate complexity  EVALUATION COMPLEXITY: Moderate  PLAN: PT FREQUENCY: 2x/week  PT DURATION: 6 weeks  PLANNED INTERVENTIONS: 97110-Therapeutic exercises, 97535- Self Care, 16109- Manual therapy, and 97597- Wound care (first 20 sq cm)  PLAN FOR NEXT SESSION: continue with debridement, manual and compression dressing.   Leodis Rainwater, PT CLT [phone]  08/15/2023, 5:09 PM

## 2023-08-19 ENCOUNTER — Ambulatory Visit (HOSPITAL_COMMUNITY): Admitting: Physical Therapy

## 2023-08-19 ENCOUNTER — Encounter (HOSPITAL_COMMUNITY): Payer: Self-pay | Admitting: Physical Therapy

## 2023-08-19 DIAGNOSIS — R600 Localized edema: Secondary | ICD-10-CM

## 2023-08-19 DIAGNOSIS — S81801A Unspecified open wound, right lower leg, initial encounter: Secondary | ICD-10-CM

## 2023-08-19 NOTE — Therapy (Signed)
 OUTPATIENT PHYSICAL THERAPY Wound Treatment   Patient Name: [name] MRN: [id number] DOB:04/26/1931, 89 y.o., female Today's Date: 08/19/2023   PCP: Minus Amel, MD REFERRING PROVIDER: Minus Amel, MD  END OF SESSION:  PT End of Session - 08/19/23 1521     Visit Number 4    Number of Visits 12    Date for PT Re-Evaluation 09/19/23    Authorization Type [employer]    Progress Note Due on Visit 10    PT Start Time 1340    PT Stop Time 1420    PT Time Calculation (min) 40 min    Activity Tolerance Patient tolerated treatment well    Behavior During Therapy [facility] for tasks assessed/performed          Past Medical History:  Diagnosis Date   Bilateral hip joint arthritis 09/06/2013   Degeneration of intervertebral disc of lumbar region 09/06/2013   Hypertension    Past Surgical History:  Procedure Laterality Date   ABDOMINAL HYSTERECTOMY     APPENDECTOMY     BREAST BIOPSY     CATARACT EXTRACTION W/PHACO Right 09/03/2012   Procedure: CATARACT EXTRACTION PHACO AND INTRAOCULAR LENS PLACEMENT (IOC);  Surgeon: Anner Kill, MD;  Location: [facility];  Service: Ophthalmology;  Laterality: Right;  CDE: 12.37   CATARACT EXTRACTION W/PHACO Left 09/28/2012   Procedure: CATARACT EXTRACTION PHACO AND INTRAOCULAR LENS PLACEMENT (IOC);  Surgeon: Anner Kill, MD;  Location: [facility];  Service: Ophthalmology;  Laterality: Left;  CDE 18.52   closed redution external fixation right wrist Right    INSERTION OF MESH N/A 06/30/2015   Procedure: INSERTION OF MESH;  Surgeon: Alanda Allegra, MD;  Location: [facility];  Service: General;  Laterality: N/A;   JOINT REPLACEMENT     bilateral Hips at [facility] FUSION     Dr. Joanette Moynahan   TONSILLECTOMY     UMBILICAL HERNIA REPAIR N/A 06/30/2015   Procedure: UMBILICAL HERNIORRHAPHY WITH MESH;  Surgeon: Alanda Allegra, MD;  Location: [facility];  Service: General;  Laterality: N/A;   Patient Active Problem List   Diagnosis Date Noted   Essential  hypertension 07/14/2022   Generalized weakness 07/14/2022   UTI (urinary tract infection) 07/14/2022   Peripheral edema 07/14/2022   Acute metabolic encephalopathy 07/13/2022   Bilateral hip joint arthritis 09/06/2013   Degeneration of intervertebral disc of lumbar region 09/06/2013   Backache 01/24/2009   Closed fracture of metatarsal bone 04/20/2007    ONSET DATE: 07/08/23 approximate  REFERRING DIAG: A46 (ICD-10-CM) - Erysipelas  THERAPY DIAG: edema, non healing wounds of right leg   Rationale for Evaluation and Treatment: Rehabilitation     Wound Therapy - 08/19/23 1533     Subjective pt reports she is doing well today.    Patient and Family Stated Goals for her wounds to heal    Date of Onset 07/08/23   approximate as pt states that it snuck up on her.   Prior Treatments self care    Pain Scale 0-10    Pain Score 0-No pain    Evaluation and Treatment Procedures Explained to Patient/Family Yes    Evaluation and Treatment Procedures agreed to    Wound Properties Date First Assessed: 08/08/23 Time First Assessed: 1445 Location: Leg Location Orientation: Right DO NOT USE: Wound Type: Other (Comment) , most likely from lymphedema  DO NOT USE:  Present on Admission: Yes   Wound Image Images linked: 3    Dressing Type None  Dressing Change Frequency PRN    Site / Wound Assessment Yellow    % Wound base Red or Granulating 70%    % Wound base Yellow/Fibrinous Exudate 30%    Drainage Amount Minimal    Drainage Description Serous    Treatment Cleansed;Debridement (Selective)    Selective Debridement (non-excisional) - Location wound beds    Selective Debridement (non-excisional) - Tools Used Forceps;Scalpel    Selective Debridement (non-excisional) - Tissue Removed slough    Wound Therapy - Clinical Statement see below    Wound Therapy - Functional Problem List difficulty in dressing and bathing    Factors Delaying/Impairing Wound Healing Vascular compromise;Immobility     Hydrotherapy Plan Debridement;Dressing change;Patient/family education    Wound Therapy - Frequency 2X / week   for 6  weeks   Wound Therapy - Current Recommendations PT    Wound Plan continue with debridement and dressing change.    Dressing  Rt:  silver hydrofiber to wounds on Rt followed by kerlix, cotton to shape, coban and #5 netting    Dressing Lt: kerlix, cotton to shape, coban, #5 netting    Manual Therapy manual to both LE to promote decongestion of LE         Hx: [name] is a 89 y.o. female with medical history significant of osteoarthritis, hypertension   PATIENT EDUCATION: Education details: If dressing is uncomfortable take the brown outer bandage off and assess how it feels, keep bandaging dry Person educated: Patient Education method: Explanation Education comprehension: verbalized understanding   HOME EXERCISE PROGRAM: Ankle pumps   GOALS: Goals reviewed with patient? No  SHORT TERM GOALS: Target date: 08/29/23  Pt to have 3 or less wounds on right lower extremity  Baseline: Goal status: on-going   2.  PT LE to no longer be red  Baseline:  Goal status: IN PROGRESS   LONG TERM GOALS: Target date: 09/19/23  Pt to no longer have any wounds on her leg Baseline:  Goal status: IN PROGRESS  2.  PT to be able to fit back into her compression garments Baseline:  Goal status: IN PROGRESS     ASSESSMENT:  CLINICAL IMPRESSION: No wounds remain on Lt LE.  Pt instructed to bring her compression garment to transition to for this LE next visit.  Pt with 2 small wounds Rt anterior LE approx 0.8 cm2 each.  Both were covered with 100% slough and extremely macerated perimeter when removed bandage.  Debrided away devitalized tissue and slough to reveal 100% granulation for one and 70% for the other.  Changed dressing to silver Hydrofiber due to drainage/maceration.  One other area superior to these that is not fully healed with dispersed smaller areas.   Moisturized LE's well prior to rebandaging.  Used kerlix and coban with cotton for shaping due to low supplies of profore.  Pt reported overall comfort following.    OBJECTIVE IMPAIRMENTS: increased edema and decreased skin integrity .   ACTIVITY LIMITATIONS: bathing, dressing, and hygiene/grooming  PERSONAL FACTORS: Age and Fitness are also affecting patient's functional outcome.   REHAB POTENTIAL: Good  CLINICAL DECISION MAKING: Evolving/moderate complexity  EVALUATION COMPLEXITY: Moderate  PLAN: PT FREQUENCY: 2x/week  PT DURATION: 6 weeks  PLANNED INTERVENTIONS: 97110-Therapeutic exercises, 97535- Self Care, 59563- Manual therapy, and 97597- Wound care (first 20 sq cm)  PLAN FOR NEXT SESSION: continue with debridement, manual and compression dressing.     Lorenso Romance, PTA/CLT [facility] Ph: [phone]  08/19/2023, 3:36 PM

## 2023-08-22 ENCOUNTER — Ambulatory Visit (HOSPITAL_COMMUNITY): Admitting: Physical Therapy

## 2023-08-22 DIAGNOSIS — S81801A Unspecified open wound, right lower leg, initial encounter: Secondary | ICD-10-CM

## 2023-08-22 DIAGNOSIS — R600 Localized edema: Secondary | ICD-10-CM | POA: Diagnosis not present

## 2023-08-22 NOTE — Therapy (Signed)
 OUTPATIENT PHYSICAL THERAPY Wound Treatment   Patient Name: [name] MRN: [id number] DOB:1931-10-15, 89 y.o., female Today's Date: 08/22/2023   PCP: Minus Amel, MD REFERRING PROVIDER: Minus Amel, MD  END OF SESSION:  PT End of Session - 08/22/23 1659     Visit Number 5    Number of Visits 12    Date for PT Re-Evaluation 09/19/23    Authorization Type [employer]    Progress Note Due on Visit 10    PT Start Time 1450    PT Stop Time 1540    PT Time Calculation (min) 50 min    Activity Tolerance Patient tolerated treatment well    Behavior During Therapy [facility] for tasks assessed/performed          Past Medical History:  Diagnosis Date   Bilateral hip joint arthritis 09/06/2013   Degeneration of intervertebral disc of lumbar region 09/06/2013   Hypertension    Past Surgical History:  Procedure Laterality Date   ABDOMINAL HYSTERECTOMY     APPENDECTOMY     BREAST BIOPSY     CATARACT EXTRACTION W/PHACO Right 09/03/2012   Procedure: CATARACT EXTRACTION PHACO AND INTRAOCULAR LENS PLACEMENT (IOC);  Surgeon: Anner Kill, MD;  Location: [facility];  Service: Ophthalmology;  Laterality: Right;  CDE: 12.37   CATARACT EXTRACTION W/PHACO Left 09/28/2012   Procedure: CATARACT EXTRACTION PHACO AND INTRAOCULAR LENS PLACEMENT (IOC);  Surgeon: Anner Kill, MD;  Location: [facility];  Service: Ophthalmology;  Laterality: Left;  CDE 18.52   closed redution external fixation right wrist Right    INSERTION OF MESH N/A 06/30/2015   Procedure: INSERTION OF MESH;  Surgeon: Alanda Allegra, MD;  Location: [facility];  Service: General;  Laterality: N/A;   JOINT REPLACEMENT     bilateral Hips at [facility] FUSION     Dr. Joanette Moynahan   TONSILLECTOMY     UMBILICAL HERNIA REPAIR N/A 06/30/2015   Procedure: UMBILICAL HERNIORRHAPHY WITH MESH;  Surgeon: Alanda Allegra, MD;  Location: [facility];  Service: General;  Laterality: N/A;   Patient Active Problem List   Diagnosis Date Noted   Essential  hypertension 07/14/2022   Generalized weakness 07/14/2022   UTI (urinary tract infection) 07/14/2022   Peripheral edema 07/14/2022   Acute metabolic encephalopathy 07/13/2022   Bilateral hip joint arthritis 09/06/2013   Degeneration of intervertebral disc of lumbar region 09/06/2013   Backache 01/24/2009   Closed fracture of metatarsal bone 04/20/2007    ONSET DATE: 07/08/23 approximate  REFERRING DIAG: A46 (ICD-10-CM) - Erysipelas  THERAPY DIAG: edema, non healing wounds of right leg   Rationale for Evaluation and Treatment: Rehabilitation     Wound Therapy - 08/22/23 0001     Subjective Pt states she has no pain , she brought her compression garment    Patient and Family Stated Goals wounds to heal    Date of Onset 07/08/23   approximate as pt states that it snuck up on her.   Prior Treatments self care    Pain Scale 0-10    Pain Score 0-No pain    Evaluation and Treatment Procedures Explained to Patient/Family Yes    Evaluation and Treatment Procedures agreed to    Wound Properties Date First Assessed: 08/08/23 Time First Assessed: 1445 Location: Leg Location Orientation: Right DO NOT USE: Wound Type: Other (Comment) , most likely from lymphedema  DO NOT USE:  Present on Admission: Yes   Dressing Type Compression wrap    Dressing Changed Changed  Dressing Status Old drainage    Dressing Change Frequency PRN    Site / Wound Assessment Yellow    % Wound base Red or Granulating 80%   following cleansing and debridement   % Wound base Yellow/Fibrinous Exudate 20%    Wound Length (cm) --   pt is now down to two small wounds both of which have purulent drainage in wound bed upon removal of bandage.   Drainage Amount Minimal    Drainage Description Purulent    Treatment Cleansed;Debridement (Selective)    Selective Debridement (non-excisional) - Location wound beds    Selective Debridement (non-excisional) - Tools Used Forceps    Selective Debridement (non-excisional) -  Tissue Removed slough    Wound Therapy - Clinical Statement see below    Wound Therapy - Functional Problem List difficulty in dressing and bathing    Factors Delaying/Impairing Wound Healing Vascular compromise;Immobility    Hydrotherapy Plan Debridement;Dressing change;Patient/family education    Wound Therapy - Frequency 2X / week   for 6  weeks   Wound Therapy - Current Recommendations PT    Wound Plan continue with debridement and dressing change.    Dressing  Rt:  silver hydrofiber to wounds on Rt followed by kerlix, cotton to shape, coban and #5 netting    Dressing donning of compression garment    Manual Therapy manual to both LE to promote decongestion of LE         Hx: [name] is a 89 y.o. female with medical history significant of osteoarthritis, hypertension   PATIENT EDUCATION: Education details: If dressing is uncomfortable take the brown outer bandage off and assess how it feels, keep bandaging dry Person educated: Patient Education method: Explanation Education comprehension: verbalized understanding   HOME EXERCISE PROGRAM: Ankle pumps   GOALS: Goals reviewed with patient? No  SHORT TERM GOALS: Target date: 08/29/23  Pt to have 3 or less wounds on right lower extremity  Baseline: Goal status: on-going   2.  PT LE to no longer be red  Baseline:  Goal status: IN PROGRESS   LONG TERM GOALS: Target date: 09/19/23  Pt to no longer have any wounds on her leg Baseline:  Goal status: IN PROGRESS  2.  PT to be able to fit back into her compression garments Baseline:  Goal status: IN PROGRESS     ASSESSMENT:  CLINICAL IMPRESSION:Pt LE no longer have any wounds.  Therapist instructed patient in the use of a Butler for donning compression garment.  Therapist measured LE for compression garments and gave patient information on ETI.  Therapist recommends 15-20 mmHG, the pt most likely needs 20-30, however, she lives by herself and is 89 years of age  and does not feel that she would be able to don the 20-33mmHG.  Will discontinue treatment on her Lt LE.  Rt LE will continue to need skilled care until all wounds are resolved.    OBJECTIVE IMPAIRMENTS: increased edema and decreased skin integrity .   ACTIVITY LIMITATIONS: bathing, dressing, and hygiene/grooming  PERSONAL FACTORS: Age and Fitness are also affecting patient's functional outcome.   REHAB POTENTIAL: Good  CLINICAL DECISION MAKING: Evolving/moderate complexity  EVALUATION COMPLEXITY: Moderate  PLAN: PT FREQUENCY: 2x/week  PT DURATION: 6 weeks  PLANNED INTERVENTIONS: 97110-Therapeutic exercises, 97535- Self Care, 09811- Manual therapy, and 97597- Wound care (first 20 sq cm)  PLAN FOR NEXT SESSION: continue with debridement, manual and compression dressing.     Leodis Rainwater, PT CLT [phone]  08/22/2023,  5:08 PM

## 2023-08-26 ENCOUNTER — Ambulatory Visit (HOSPITAL_COMMUNITY): Admitting: Physical Therapy

## 2023-08-26 DIAGNOSIS — S81801A Unspecified open wound, right lower leg, initial encounter: Secondary | ICD-10-CM

## 2023-08-26 DIAGNOSIS — R600 Localized edema: Secondary | ICD-10-CM

## 2023-08-26 NOTE — Therapy (Signed)
 OUTPATIENT PHYSICAL THERAPY Wound Treatment   Patient Name: [name] MRN: [id number] DOB:1931/05/08, 89 y.o., female Today's Date: 08/26/2023   PCP: Marvine Rush, MD REFERRING PROVIDER: Marvine Rush, MD  END OF SESSION:  PT End of Session - 08/26/23 1627     Visit Number 6    Number of Visits 12    Date for PT Re-Evaluation 09/19/23    Authorization Type health team [employer]    Progress Note Due on Visit 10    PT Start Time 1535    PT Stop Time 1608    PT Time Calculation (min) 33 min    Activity Tolerance Patient tolerated treatment well    Behavior During Therapy [facility] for tasks assessed/performed          Past Medical History:  Diagnosis Date   Bilateral hip joint arthritis 09/06/2013   Degeneration of intervertebral disc of lumbar region 09/06/2013   Hypertension    Past Surgical History:  Procedure Laterality Date   ABDOMINAL HYSTERECTOMY     APPENDECTOMY     BREAST BIOPSY     CATARACT EXTRACTION W/PHACO Right 09/03/2012   Procedure: CATARACT EXTRACTION PHACO AND INTRAOCULAR LENS PLACEMENT (IOC);  Surgeon: Cherene Mania, MD;  Location: [facility];  Service: Ophthalmology;  Laterality: Right;  CDE: 12.37   CATARACT EXTRACTION W/PHACO Left 09/28/2012   Procedure: CATARACT EXTRACTION PHACO AND INTRAOCULAR LENS PLACEMENT (IOC);  Surgeon: Cherene Mania, MD;  Location: [facility];  Service: Ophthalmology;  Laterality: Left;  CDE 18.52   closed redution external fixation right wrist Right    INSERTION OF MESH N/A 06/30/2015   Procedure: INSERTION OF MESH;  Surgeon: Oneil Budge, MD;  Location: [facility];  Service: General;  Laterality: N/A;   JOINT REPLACEMENT     bilateral Hips at [facility] FUSION     Dr. Gaither   TONSILLECTOMY     UMBILICAL HERNIA REPAIR N/A 06/30/2015   Procedure: UMBILICAL HERNIORRHAPHY WITH MESH;  Surgeon: Oneil Budge, MD;  Location: [facility];  Service: General;  Laterality: N/A;   Patient Active Problem List   Diagnosis Date Noted   Essential  hypertension 07/14/2022   Generalized weakness 07/14/2022   UTI (urinary tract infection) 07/14/2022   Peripheral edema 07/14/2022   Acute metabolic encephalopathy 07/13/2022   Bilateral hip joint arthritis 09/06/2013   Degeneration of intervertebral disc of lumbar region 09/06/2013   Backache 01/24/2009   Closed fracture of metatarsal bone 04/20/2007    ONSET DATE: 07/08/23 approximate  REFERRING DIAG: A46 (ICD-10-CM) - Erysipelas  THERAPY DIAG: edema, non healing wounds of right leg   Rationale for Evaluation and Treatment: Rehabilitation     Wound Therapy - 08/26/23 0001     Subjective Pt states she has no pain , she brought her compression garment    Patient and Family Stated Goals wounds to heal    Date of Onset 07/08/23   approximate as pt states that it snuck up on her.   Prior Treatments self care    Pain Scale 0-10    Pain Score 0-No pain    Evaluation and Treatment Procedures Explained to Patient/Family Yes    Evaluation and Treatment Procedures agreed to    Wound Properties Date First Assessed: 08/08/23 Time First Assessed: 1445 Location: Leg Location Orientation: Right DO NOT USE: Wound Type: Other (Comment) , most likely from lymphedema  DO NOT USE:  Present on Admission: Yes   Dressing Type Compression wrap    Dressing Status Old  drainage    Dressing Change Frequency PRN    Site / Wound Assessment Yellow    % Wound base Red or Granulating 90%    % Wound base Yellow/Fibrinous Exudate 10%    Drainage Amount Scant    Drainage Description Purulent    Treatment Cleansed;Debridement (Selective)    Selective Debridement (non-excisional) - Location wound beds    Selective Debridement (non-excisional) - Tools Used Forceps    Selective Debridement (non-excisional) - Tissue Removed slough    Wound Therapy - Clinical Statement see below    Wound Therapy - Functional Problem List difficulty in dressing and bathing    Factors Delaying/Impairing Wound Healing Vascular  compromise;Immobility    Hydrotherapy Plan Debridement;Dressing change;Patient/family education    Wound Therapy - Frequency 2X / week   for 6  weeks   Wound Therapy - Current Recommendations PT    Wound Plan continue with debridement and dressing change.    Dressing  cotton to shape, coban and #5 netting    Manual Therapy manual to RT  LE to promote decongestion of LE         Hx: [name] is a 89 y.o. female with medical history significant of osteoarthritis, hypertension   PATIENT EDUCATION: Education details: If dressing is uncomfortable take the brown outer bandage off and assess how it feels, keep bandaging dry Person educated: Patient Education method: Explanation Education comprehension: verbalized understanding   HOME EXERCISE PROGRAM: Ankle pumps   GOALS: Goals reviewed with patient? No  SHORT TERM GOALS: Target date: 08/29/23  Pt to have 3 or less wounds on right lower extremity  Baseline: Goal status: on-going   2.  PT LE to no longer be red  Baseline:  Goal status: IN PROGRESS   LONG TERM GOALS: Target date: 09/19/23  Pt to no longer have any wounds on her leg Baseline:  Goal status: IN PROGRESS  2.  PT to be able to fit back into her compression garments Baseline:  Goal status: IN PROGRESS     ASSESSMENT:  CLINICAL IMPRESSION:PT states that it is difficult for her to use the  [facility] for donning compression garment but she is trying to get the hang of it.  PT compression garment on her RT LE does not have it's elasticity as it had fallen down to the distal 1/2 of her leg.  Pt states that the stronger compression is to uncomfortable.  Therapist explained that she would rather her go down to 15-20 mm HG and the garment fit correctly that the garment sliding down.  Pt has not ordered any new garments from [facility].    Pt  Rt LE only has one small wound left and most likely will be able to be discharge next treatment.    OBJECTIVE IMPAIRMENTS:  increased edema and decreased skin integrity .   ACTIVITY LIMITATIONS: bathing, dressing, and hygiene/grooming  PERSONAL FACTORS: Age and Fitness are also affecting patient's functional outcome.   REHAB POTENTIAL: Good  CLINICAL DECISION MAKING: Evolving/moderate complexity  EVALUATION COMPLEXITY: Moderate  PLAN: PT FREQUENCY: 2x/week  PT DURATION: 6 weeks  PLANNED INTERVENTIONS: 97110-Therapeutic exercises, 97535- Self Care, 02859- Manual therapy, and 97597- Wound care (first 20 sq cm)  PLAN FOR NEXT SESSION: continue with debridement, manual and compression dressing.     Montie Metro, PT CLT [phone]  08/26/2023, 4:32 PM

## 2023-08-29 ENCOUNTER — Ambulatory Visit (HOSPITAL_COMMUNITY)

## 2023-08-29 ENCOUNTER — Encounter (HOSPITAL_COMMUNITY): Payer: Self-pay

## 2023-08-29 DIAGNOSIS — R600 Localized edema: Secondary | ICD-10-CM | POA: Diagnosis not present

## 2023-08-29 DIAGNOSIS — S81801A Unspecified open wound, right lower leg, initial encounter: Secondary | ICD-10-CM

## 2023-08-29 NOTE — Therapy (Signed)
 OUTPATIENT PHYSICAL THERAPY Wound Treatment   Patient Name: [name] MRN: [id number] DOB:22-Feb-1932, 89 y.o., female Today's Date: 08/29/2023   PCP: Marvine Rush, MD REFERRING PROVIDER: Marvine Rush, MD  END OF SESSION:  PT End of Session - 08/29/23 1520     Visit Number 7    Number of Visits 12    Date for PT Re-Evaluation 09/19/23    Authorization Type [employer]    Progress Note Due on Visit 10    PT Start Time 1443    PT Stop Time 1513    PT Time Calculation (min) 30 min    Activity Tolerance Patient tolerated treatment well    Behavior During Therapy [facility] for tasks assessed/performed          Past Medical History:  Diagnosis Date   Bilateral hip joint arthritis 09/06/2013   Degeneration of intervertebral disc of lumbar region 09/06/2013   Hypertension    Past Surgical History:  Procedure Laterality Date   ABDOMINAL HYSTERECTOMY     APPENDECTOMY     BREAST BIOPSY     CATARACT EXTRACTION W/PHACO Right 09/03/2012   Procedure: CATARACT EXTRACTION PHACO AND INTRAOCULAR LENS PLACEMENT (IOC);  Surgeon: Cherene Mania, MD;  Location: [facility];  Service: Ophthalmology;  Laterality: Right;  CDE: 12.37   CATARACT EXTRACTION W/PHACO Left 09/28/2012   Procedure: CATARACT EXTRACTION PHACO AND INTRAOCULAR LENS PLACEMENT (IOC);  Surgeon: Cherene Mania, MD;  Location: [facility];  Service: Ophthalmology;  Laterality: Left;  CDE 18.52   closed redution external fixation right wrist Right    INSERTION OF MESH N/A 06/30/2015   Procedure: INSERTION OF MESH;  Surgeon: Oneil Budge, MD;  Location: [facility];  Service: General;  Laterality: N/A;   JOINT REPLACEMENT     bilateral Hips at [facility] FUSION     Dr. Gaither   TONSILLECTOMY     UMBILICAL HERNIA REPAIR N/A 06/30/2015   Procedure: UMBILICAL HERNIORRHAPHY WITH MESH;  Surgeon: Oneil Budge, MD;  Location: [facility];  Service: General;  Laterality: N/A;   Patient Active Problem List   Diagnosis Date Noted   Essential  hypertension 07/14/2022   Generalized weakness 07/14/2022   UTI (urinary tract infection) 07/14/2022   Peripheral edema 07/14/2022   Acute metabolic encephalopathy 07/13/2022   Bilateral hip joint arthritis 09/06/2013   Degeneration of intervertebral disc of lumbar region 09/06/2013   Backache 01/24/2009   Closed fracture of metatarsal bone 04/20/2007    ONSET DATE: 07/08/23 approximate  REFERRING DIAG: A46 (ICD-10-CM) - Erysipelas  THERAPY DIAG: edema, non healing wounds of right leg   Rationale for Evaluation and Treatment: Rehabilitation     Wound Therapy - 08/29/23 0001     Subjective Pt arrived with dressings intact and brought compression garment wiht her.  Reports butler is getting easier to use    Patient and Family Stated Goals wounds to heal    Date of Onset 07/08/23    Prior Treatments self care    Pain Scale 0-10    Pain Score 0-No pain    Evaluation and Treatment Procedures Explained to Patient/Family Yes    Evaluation and Treatment Procedures agreed to    Wound Properties Date First Assessed: 08/08/23 Time First Assessed: 1445 Location: Leg Location Orientation: Right DO NOT USE: Wound Type: Other (Comment) , most likely from lymphedema  DO NOT USE:  Present on Admission: Yes   Dressing Type --   compression garments, pt able to donn appropriately  Dressing Status Old drainage    Site / Wound Assessment Granulation tissue    % Wound base Red or Granulating 100%    % Wound base Yellow/Fibrinous Exudate 0%    Drainage Amount Scant    Treatment Cleansed;Debridement (Selective)    Selective Debridement (non-excisional) - Location wound beds    Selective Debridement (non-excisional) - Tools Used Forceps    Selective Debridement (non-excisional) - Tissue Removed slough    Wound Therapy - Clinical Statement see below    Wound Therapy - Functional Problem List difficulty in dressing and bathing    Factors Delaying/Impairing Wound Healing Vascular compromise;Immobility     Hydrotherapy Plan Debridement;Dressing change;Patient/family education    Wound Therapy - Frequency 2X / week    Wound Therapy - Current Recommendations PT    Wound Plan continue with debridement and dressing change.    Dressing  Pt able to donn compression garments with use of butler    Manual Therapy manual to RT  LE to promote decongestion of LE         Hx: [name] is a 89 y.o. female with medical history significant of osteoarthritis, hypertension   PATIENT EDUCATION: Education details: If dressing is uncomfortable take the brown outer bandage off and assess how it feels, keep bandaging dry Person educated: Patient Education method: Explanation Education comprehension: verbalized understanding   HOME EXERCISE PROGRAM: Ankle pumps   GOALS: Goals reviewed with patient? No  SHORT TERM GOALS: Target date: 08/29/23  Pt to have 3 or less wounds on right lower extremity  Baseline: Goal status: on-going   2.  PT LE to no longer be red  Baseline:  Goal status: IN PROGRESS   LONG TERM GOALS: Target date: 09/19/23  Pt to no longer have any wounds on her leg Baseline:  Goal status: IN PROGRESS  2.  PT to be able to fit back into her compression garments Baseline:  Goal status: IN PROGRESS     ASSESSMENT:  CLINICAL IMPRESSION: Manual retrograde massage complete for edema control that assisted with debridement.  Selective debridement for removal of scab on anterior Rt LE revealing no wounds present.  Pt able to donn compression garments with use of butler independently.  PT educated importance of no wrinkles for equal compression distribution.  Pt to be discharged to self care.   OBJECTIVE IMPAIRMENTS: increased edema and decreased skin integrity .   ACTIVITY LIMITATIONS: bathing, dressing, and hygiene/grooming  PERSONAL FACTORS: Age and Fitness are also affecting patient's functional outcome.   REHAB POTENTIAL: Good  CLINICAL DECISION MAKING:  Evolving/moderate complexity  EVALUATION COMPLEXITY: Moderate  PLAN: PT FREQUENCY: 2x/week  PT DURATION: 6 weeks  PLANNED INTERVENTIONS: 97110-Therapeutic exercises, 97535- Self Care, 02859- Manual therapy, and 97597- Wound care (first 20 sq cm)  PLAN FOR NEXT SESSION: DC to self care.     Augustin Mclean, LPTA/CLT; WILLAIM [phone]  08/29/2023, 3:21 PM

## 2023-09-01 ENCOUNTER — Ambulatory Visit (HOSPITAL_COMMUNITY)

## 2023-09-02 ENCOUNTER — Ambulatory Visit (HOSPITAL_COMMUNITY): Admitting: Physical Therapy

## 2023-09-03 ENCOUNTER — Ambulatory Visit (HOSPITAL_COMMUNITY): Admitting: Physical Therapy

## 2023-09-04 ENCOUNTER — Ambulatory Visit (HOSPITAL_COMMUNITY): Admitting: Physical Therapy

## 2023-10-09 DIAGNOSIS — E6609 Other obesity due to excess calories: Secondary | ICD-10-CM | POA: Diagnosis not present

## 2023-10-09 DIAGNOSIS — Z6831 Body mass index (BMI) 31.0-31.9, adult: Secondary | ICD-10-CM | POA: Diagnosis not present

## 2023-10-09 DIAGNOSIS — M810 Age-related osteoporosis without current pathological fracture: Secondary | ICD-10-CM | POA: Diagnosis not present

## 2023-10-09 DIAGNOSIS — I10 Essential (primary) hypertension: Secondary | ICD-10-CM | POA: Diagnosis not present

## 2023-10-09 DIAGNOSIS — M1991 Primary osteoarthritis, unspecified site: Secondary | ICD-10-CM | POA: Diagnosis not present

## 2023-10-09 DIAGNOSIS — I872 Venous insufficiency (chronic) (peripheral): Secondary | ICD-10-CM | POA: Diagnosis not present

## 2023-10-09 DIAGNOSIS — Z96641 Presence of right artificial hip joint: Secondary | ICD-10-CM | POA: Diagnosis not present

## 2023-10-09 DIAGNOSIS — A46 Erysipelas: Secondary | ICD-10-CM | POA: Diagnosis not present

## 2023-10-09 DIAGNOSIS — Z96642 Presence of left artificial hip joint: Secondary | ICD-10-CM | POA: Diagnosis not present

## 2023-10-17 DIAGNOSIS — I872 Venous insufficiency (chronic) (peripheral): Secondary | ICD-10-CM | POA: Diagnosis not present

## 2023-10-17 DIAGNOSIS — E6609 Other obesity due to excess calories: Secondary | ICD-10-CM | POA: Diagnosis not present

## 2023-10-17 DIAGNOSIS — Z6830 Body mass index (BMI) 30.0-30.9, adult: Secondary | ICD-10-CM | POA: Diagnosis not present

## 2023-10-17 DIAGNOSIS — A46 Erysipelas: Secondary | ICD-10-CM | POA: Diagnosis not present

## 2023-11-01 ENCOUNTER — Other Ambulatory Visit: Payer: Self-pay

## 2023-11-01 ENCOUNTER — Emergency Department (HOSPITAL_COMMUNITY)
Admission: EM | Admit: 2023-11-01 | Discharge: 2023-11-01 | Disposition: A | Discharge status: Home / Self Care | Attending: Emergency Medicine | Admitting: Emergency Medicine

## 2023-11-01 ENCOUNTER — Emergency Department (HOSPITAL_COMMUNITY)

## 2023-11-01 ENCOUNTER — Encounter (HOSPITAL_COMMUNITY): Payer: Self-pay | Admitting: Emergency Medicine

## 2023-11-01 DIAGNOSIS — W19XXXA Unspecified fall, initial encounter: Secondary | ICD-10-CM | POA: Diagnosis not present

## 2023-11-01 DIAGNOSIS — S20211A Contusion of right front wall of thorax, initial encounter: Secondary | ICD-10-CM | POA: Diagnosis not present

## 2023-11-01 DIAGNOSIS — S0101XA Laceration without foreign body of scalp, initial encounter: Secondary | ICD-10-CM | POA: Insufficient documentation

## 2023-11-01 DIAGNOSIS — W1830XA Fall on same level, unspecified, initial encounter: Secondary | ICD-10-CM | POA: Diagnosis not present

## 2023-11-01 DIAGNOSIS — Z23 Encounter for immunization: Secondary | ICD-10-CM | POA: Insufficient documentation

## 2023-11-01 DIAGNOSIS — S0990XA Unspecified injury of head, initial encounter: Secondary | ICD-10-CM

## 2023-11-01 DIAGNOSIS — R079 Chest pain, unspecified: Secondary | ICD-10-CM | POA: Diagnosis not present

## 2023-11-01 DIAGNOSIS — D72829 Elevated white blood cell count, unspecified: Secondary | ICD-10-CM | POA: Insufficient documentation

## 2023-11-01 DIAGNOSIS — I3139 Other pericardial effusion (noninflammatory): Secondary | ICD-10-CM | POA: Diagnosis not present

## 2023-11-01 DIAGNOSIS — R270 Ataxia, unspecified: Secondary | ICD-10-CM | POA: Diagnosis not present

## 2023-11-01 DIAGNOSIS — Z7982 Long term (current) use of aspirin: Secondary | ICD-10-CM | POA: Insufficient documentation

## 2023-11-01 DIAGNOSIS — I10 Essential (primary) hypertension: Secondary | ICD-10-CM | POA: Diagnosis not present

## 2023-11-01 DIAGNOSIS — Z96643 Presence of artificial hip joint, bilateral: Secondary | ICD-10-CM | POA: Diagnosis not present

## 2023-11-01 DIAGNOSIS — S199XXA Unspecified injury of neck, initial encounter: Secondary | ICD-10-CM | POA: Diagnosis not present

## 2023-11-01 DIAGNOSIS — Z043 Encounter for examination and observation following other accident: Secondary | ICD-10-CM | POA: Diagnosis not present

## 2023-11-01 LAB — CBC WITH DIFFERENTIAL/PLATELET
Abs Immature Granulocytes: 0.05 K/uL (ref 0.00–0.07)
Basophils Absolute: 0.1 K/uL (ref 0.0–0.1)
Basophils Relative: 1 %
Eosinophils Absolute: 0 K/uL (ref 0.0–0.5)
Eosinophils Relative: 0 %
HCT: 36.4 % (ref 36.0–46.0)
Hemoglobin: 12.1 g/dL (ref 12.0–15.0)
Immature Granulocytes: 0 %
Lymphocytes Relative: 6 %
Lymphs Abs: 0.8 K/uL (ref 0.7–4.0)
MCH: 30.7 pg (ref 26.0–34.0)
MCHC: 33.2 g/dL (ref 30.0–36.0)
MCV: 92.4 fL (ref 80.0–100.0)
Monocytes Absolute: 1 K/uL (ref 0.1–1.0)
Monocytes Relative: 8 %
Neutro Abs: 11.1 K/uL — ABNORMAL HIGH (ref 1.7–7.7)
Neutrophils Relative %: 85 %
Platelets: 259 K/uL (ref 150–400)
RBC: 3.94 MIL/uL (ref 3.87–5.11)
RDW: 13.2 % (ref 11.5–15.5)
WBC: 13 K/uL — ABNORMAL HIGH (ref 4.0–10.5)
nRBC: 0 % (ref 0.0–0.2)

## 2023-11-01 LAB — MAGNESIUM: Magnesium: 2 mg/dL (ref 1.7–2.4)

## 2023-11-01 LAB — CK: Total CK: 553 U/L — ABNORMAL HIGH (ref 38–234)

## 2023-11-01 LAB — TROPONIN I (HIGH SENSITIVITY): Troponin I (High Sensitivity): 9 ng/L (ref <18)

## 2023-11-01 LAB — COMPREHENSIVE METABOLIC PANEL WITH GFR
ALT: 22 U/L (ref 0–44)
AST: 31 U/L (ref 15–41)
Albumin: 3.5 g/dL (ref 3.5–5.0)
Alkaline Phosphatase: 49 U/L (ref 38–126)
Anion gap: 13 (ref 5–15)
BUN: 18 mg/dL (ref 8–23)
CO2: 22 mmol/L (ref 22–32)
Calcium: 8.8 mg/dL — ABNORMAL LOW (ref 8.9–10.3)
Chloride: 108 mmol/L (ref 98–111)
Creatinine, Ser: 0.87 mg/dL (ref 0.44–1.00)
GFR, Estimated: >60 mL/min (ref >60)
Glucose, Bld: 115 mg/dL — ABNORMAL HIGH (ref 70–99)
Potassium: 3.5 mmol/L (ref 3.5–5.1)
Sodium: 143 mmol/L (ref 135–145)
Total Bilirubin: 0.7 mg/dL (ref 0.0–1.2)
Total Protein: 6.4 g/dL — ABNORMAL LOW (ref 6.5–8.1)

## 2023-11-01 MED ORDER — LIDOCAINE-EPINEPHRINE (PF) 2 %-1:200000 IJ SOLN
20.0000 mL | Freq: Once | INTRAMUSCULAR | Status: AC
  Administered 2023-11-01: 20 mL
  Filled 2023-11-01: qty 20

## 2023-11-01 MED ORDER — TETANUS-DIPHTH-ACELL PERTUSSIS 5-2.5-18.5 LF-MCG/0.5 IM SUSY
0.5000 mL | PREFILLED_SYRINGE | Freq: Once | INTRAMUSCULAR | Status: AC
  Administered 2023-11-01: 0.5 mL via INTRAMUSCULAR
  Filled 2023-11-01: qty 0.5

## 2023-11-01 NOTE — ED Provider Notes (Signed)
 [place] EMERGENCY DEPARTMENT AT [facility] Provider Note   CSN: [id number] Arrival date & time: 11/01/23  1329     Patient presents with: [name] is a 89 y.o. female.   Patient is a 89 year old female who presents to the emergency department from home secondary to a fall.  Patient notes that she is unsure of when she fell or how she fell.  Patient notes that she does remember walking through her kitchen and believes she may have tripped.  She was found by one of her neighbors and is unsure of how long she was on the ground for.  Patient does have a new laceration to the posterior aspect of her scalp.  She denies any anticoagulation use.  Patient currently denies any chest pain, shortness of breath, palpitations, abdominal pain.  She denies any other long bone or joint pain.  She denies any pain to her neck or back.  She denies any active dizziness, lightheadedness.  Patient notes that she does not believe that she passed out.   Fall       Prior to Admission medications   Medication Sig Start Date End Date Taking? Authorizing Provider  aspirin  EC 81 MG tablet Take 1 tablet (81 mg total) by mouth daily. Swallow whole. 07/16/22   Ricky Fines, MD  diltiazem  (CARDIZEM  CD) 180 MG 24 hr capsule Take 1 capsule (180 mg total) by mouth daily. 07/16/22   Ricky Fines, MD  metoprolol  tartrate (LOPRESSOR ) 100 MG tablet Take 1 tablet (100 mg total) by mouth 2 (two) times daily. 07/15/22   Ricky Fines, MD    Allergies: Patient has no known allergies.    Review of Systems  Skin:        Laceration  All other systems reviewed and are negative.   Updated Vital Signs BP (!) 163/69   Pulse 84   Temp 97.8 F (36.6 C) (Oral)   Ht 5' 5 (1.651 m)   Wt 77.1 kg   SpO2 100%   BMI 28.29 kg/m   Physical Exam Vitals and nursing note reviewed.  Constitutional:      General: She is not in acute distress.    Appearance: Normal appearance. She is not  ill-appearing.  HENT:     Head: Normocephalic and atraumatic.     Nose: Nose normal.     Mouth/Throat:     Mouth: Mucous membranes are moist.  Eyes:     Extraocular Movements: Extraocular movements intact.     Conjunctiva/sclera: Conjunctivae normal.     Pupils: Pupils are equal, round, and reactive to light.  Cardiovascular:     Rate and Rhythm: Normal rate and regular rhythm.     Pulses: Normal pulses.     Heart sounds: Normal heart sounds. No murmur heard.    No gallop.  Pulmonary:     Effort: Pulmonary effort is normal. No respiratory distress.     Breath sounds: Normal breath sounds. No stridor. No wheezing, rhonchi or rales.  Chest:     Chest wall: No tenderness.  Abdominal:     General: Abdomen is flat. Bowel sounds are normal. There is no distension.     Palpations: Abdomen is soft.     Tenderness: There is no abdominal tenderness. There is no guarding.  Musculoskeletal:        General: Normal range of motion.     Cervical back: Normal range of motion and neck supple. No rigidity or tenderness.  Comments: Nontender palpation of the bilateral upper and lower extremities, pelvis stable to AP and lateral compression, peripheral pulses 2+ upper and lower extremities, sensation intact distally, full range of motion noted throughout, no obvious deformity or bruising, no skin breakdown or ulceration, no lacerations or abrasions, nontender palpation over midline thoracic or lumbar spine, no step-off or deformity, bruising noted over the right posterior chest wall, mild tenderness over the site  Skin:    General: Skin is warm and dry.     Comments: Approximately 4 cm crescent shaped laceration noted to the posterior aspect of the scalp, no active bleeding, no obvious foreign body noted within the wound, wound evaluated to depth with adequate lighting  Neurological:     General: No focal deficit present.     Mental Status: She is alert and oriented to person, place, and time. Mental  status is at baseline.  Psychiatric:        Mood and Affect: Mood normal.        Behavior: Behavior normal.        Thought Content: Thought content normal.        Judgment: Judgment normal.     (all labs ordered are listed, but only abnormal results are displayed) Labs Reviewed  COMPREHENSIVE METABOLIC PANEL WITH GFR  CBC WITH DIFFERENTIAL/PLATELET  URINALYSIS, ROUTINE W REFLEX MICROSCOPIC  MAGNESIUM  CK  TROPONIN I (HIGH SENSITIVITY)    EKG: None  Radiology: No results found.   .Critical Care  Performed by: Tracy Lonni BIRCH, PA-C Authorized by: Tracy Lonni BIRCH, PA-C   Critical care provider statement:    Critical care time (minutes):  45   Critical care was necessary to treat or prevent imminent or life-threatening deterioration of the following conditions: Complex head laceration, hemorrhage control.   Critical care was time spent personally by me on the following activities:  Development of treatment plan with patient or surrogate, discussions with consultants, evaluation of patient's response to treatment, examination of patient, ordering and review of laboratory studies, ordering and review of radiographic studies, ordering and performing treatments and interventions, pulse oximetry, re-evaluation of patient's condition and review of old charts   I assumed direction of critical care for this patient from another provider in my specialty: no     Care discussed with: admitting provider   .Laceration Repair  Date/Time: 11/01/2023 6:48 PM  Performed by: Tracy Lonni BIRCH, PA-C Authorized by: Tracy Lonni BIRCH, PA-C   Consent:    Consent obtained:  Verbal   Consent given by:  Patient   Risks discussed:  Infection, pain, need for additional repair and retained foreign body Universal protocol:    Procedure explained and questions answered to patient or proxy's satisfaction: yes     Immediately prior to procedure, a time out was called: yes     Patient  identity confirmed:  Verbally with patient, arm band, provided demographic data and hospital-assigned identification number Anesthesia:    Anesthesia method:  Local infiltration   Local anesthetic:  Lidocaine  2% WITH epi Laceration details:    Location:  Scalp   Scalp location:  Occipital   Length (cm):  4 Pre-procedure details:    Preparation:  Patient was prepped and draped in usual sterile fashion and imaging obtained to evaluate for foreign bodies Exploration:    Hemostasis achieved with:  Epinephrine , direct pressure and tied off vessels   Imaging obtained comment:  CT   Imaging outcome: foreign body not noted     Wound exploration:  wound explored through full range of motion and entire depth of wound visualized     Wound extent: no foreign body, no signs of injury, no nerve damage, no tendon damage, no underlying fracture and no vascular damage     Contaminated: no   Treatment:    Area cleansed with:  Shur-Clens and saline   Amount of cleaning:  Extensive   Irrigation solution:  Sterile saline   Irrigation method:  Pressure wash Skin repair:    Repair method:  Sutures   Suture size:  4-0   Suture material:  Nylon   Suture technique:  Simple interrupted and horizontal mattress   Number of sutures:  11 Approximation:    Approximation:  Close Repair type:    Repair type:  Complex Post-procedure details:    Dressing:  Open (no dressing)   Procedure completion:  Tolerated well, no immediate complications    Medications Ordered in the ED  Tdap (BOOSTRIX ) injection 0.5 mL (has no administration in time range)  lidocaine -EPINEPHrine  (XYLOCAINE  W/EPI) 2 %-1:200000 (PF) injection 20 mL (has no administration in time range)                                    Medical Decision Making Amount and/or Complexity of Data Reviewed Labs: ordered. Radiology: ordered.  Risk Prescription drug management.   This patient presents to the ED for concern of fall, head injury  differential diagnosis includes laceration, intracranial hemorrhage, contusion, lumbar joint fracture, vertebral fracture, rib fracture    Additional history obtained:  Additional history obtained from EMS External records from outside source obtained and reviewed including medical records   Lab Tests:  I Ordered, and personally interpreted labs.  The pertinent results include: Mild leukocytosis, no anemia, normal kidney function liver function, normal electrolytes, normal troponin, mild elevation of CK, normal magnesium   Imaging Studies ordered:  I ordered imaging studies including CT scan of head, CT cervical spine, CT chest I independently visualized and interpreted imaging which showed no acute intracranial hemorrhage, no cervical spine fracture, no traumatic process within the chest I agree with the radiologist interpretation   Medicines ordered and prescription drug management:  I ordered medication including Tdap for laceration Reevaluation of the patient after these medicines showed that the patient improved I have reviewed the patients home medicines and have made adjustments as needed   Problem List / ED Course:  Patient is doing very well at this time and is stable for discharge home.  Laceration to the posterior aspect of her scalp was repaired at the bedside with no further bleeding.  Patient was able to ambulate in the emergency department with out difficulty and under her own power.  She did not require assistance with ambulation.  She has no urinary complaints and was unable to provide a urine while in the emergency department.  Do not suspect that she needs to wait for this.  Did offer patient admission but she does state that she would like to be discharged home and that she will have someone there with her.  Patient had no concerning neurological deficits.  She does believe that her fall was mechanical in nature with no preceding symptoms.  Blood work was otherwise  unremarkable except for a mild elevation of her CK level.  Do not suspect that admission would be warranted for this and she was directed to increase her fluid intake.  She was  directed to return to emergency department in 7 days for removal of her stitches.  She had no tenderness over her thoracic or lumbar spine.  She had no other long bone or joint pain noted on exam of is moving all 4 extremities without difficulty.  Do not suspect any further advanced imaging is warranted.  Strict return cautions were discussed for any new or worsening symptoms.  Patient voiced understanding and had no additional questions.  Patient was evaluated by attending physician who is in agreement to plan.   Social Determinants of Health:  None        Final diagnoses:  None    ED Discharge Orders     None          [name] 11/01/23 1933    Tracy Prentice SAUNDERS, MD 11/01/23 [phone]

## 2023-11-01 NOTE — Discharge Instructions (Signed)
 Please follow-up closely with your primary care doctor on an outpatient basis.  Return to emergency department immediately for any new or worsening symptoms.

## 2023-11-01 NOTE — ED Triage Notes (Signed)
 Pt via EMS from home after unwitnessed fall this morning, unknown down time. Pt was found this afternoon on the floor with dried blood around her and on the back of her head; no active bleeding and pt does not take any medications including thinners. A/O x 4, ambulatory with walker. 1.5 cm lac, deep, noted to left posterior scalp with significant dries blood around the wound and on pt's clothes. Denies pain.

## 2023-11-01 NOTE — ED Notes (Signed)
 EDP at bedside to evaluate head wound

## 2023-11-11 ENCOUNTER — Ambulatory Visit: Admission: EM | Admit: 2023-11-11 | Discharge: 2023-11-11 | Disposition: A | Discharge status: Home / Self Care

## 2023-11-11 DIAGNOSIS — Z4802 Encounter for removal of sutures: Secondary | ICD-10-CM | POA: Diagnosis not present

## 2023-11-11 MED ORDER — BACITRACIN ZINC 500 UNIT/GM EX OINT
TOPICAL_OINTMENT | Freq: Once | CUTANEOUS | Status: AC
  Administered 2023-11-11: 1 via TOPICAL

## 2023-11-11 NOTE — Discharge Instructions (Addendum)
  1. Visit for suture removal (Primary) - bacitracin  ointment applied to suture site after removal - Remove sutures placed in left posterior scalp, 11 sutures removed by provider, no secondary signs of infection, no complications.

## 2023-11-11 NOTE — ED Provider Notes (Signed)
 [facility]   Note:  This document was prepared using Dragon voice recognition software and may include unintentional dictation errors.  MRN: [id number] DOB: Mar 22, 1931  Subjective:   [name] is a 89 y.o. female presenting for suture removal from posterior scalp.  The patient reports having a fall last Saturday landing on the back of her head causing laceration.  Patient had sutures placed in emergency department.  11 sutures in total were placed to close laceration.  Patient denies any secondary concern for infection or continued pain.  No current facility-administered medications for this encounter.  Current Outpatient Medications:    aspirin  EC 81 MG tablet, Take 1 tablet (81 mg total) by mouth daily. Swallow whole., Disp: 30 tablet, Rfl: 12   diltiazem  (CARDIZEM  CD) 180 MG 24 hr capsule, Take 1 capsule (180 mg total) by mouth daily., Disp: 30 capsule, Rfl: 1   metoprolol  tartrate (LOPRESSOR ) 100 MG tablet, Take 1 tablet (100 mg total) by mouth 2 (two) times daily., Disp: 60 tablet, Rfl: 1   No Known Allergies  Past Medical History:  Diagnosis Date   Bilateral hip joint arthritis 09/06/2013   Degeneration of intervertebral disc of lumbar region 09/06/2013   Hypertension      Past Surgical History:  Procedure Laterality Date   ABDOMINAL HYSTERECTOMY     APPENDECTOMY     BREAST BIOPSY     CATARACT EXTRACTION W/PHACO Right 09/03/2012   Procedure: CATARACT EXTRACTION PHACO AND INTRAOCULAR LENS PLACEMENT (IOC);  Surgeon: Cherene Mania, MD;  Location: [facility];  Service: Ophthalmology;  Laterality: Right;  CDE: 12.37   CATARACT EXTRACTION W/PHACO Left 09/28/2012   Procedure: CATARACT EXTRACTION PHACO AND INTRAOCULAR LENS PLACEMENT (IOC);  Surgeon: Cherene Mania, MD;  Location: [facility];  Service: Ophthalmology;  Laterality: Left;  CDE 18.52   closed redution external fixation right wrist Right    INSERTION OF MESH N/A 06/30/2015   Procedure: INSERTION OF MESH;  Surgeon: Oneil Budge, MD;  Location: [facility];  Service: General;  Laterality: N/A;   JOINT REPLACEMENT     bilateral Hips at [facility] FUSION     Dr. Gaither   TONSILLECTOMY     UMBILICAL HERNIA REPAIR N/A 06/30/2015   Procedure: UMBILICAL HERNIORRHAPHY WITH MESH;  Surgeon: Oneil Budge, MD;  Location: [facility];  Service: General;  Laterality: N/A;    History reviewed. No pertinent family history.  Social History   Tobacco Use   Smoking status: Never   Smokeless tobacco: Never  Substance Use Topics   Alcohol use: No   Drug use: No    ROS Refer to HPI for ROS details.  Objective:   Vitals: BP (!) 154/73 (BP Location: Right Wrist)   Pulse 69   Temp (!) 97.5 F (36.4 C) (Oral)   Resp 20   SpO2 96%   Physical Exam Vitals and nursing note reviewed.  Constitutional:      General: She is not in acute distress.    Appearance: Normal appearance. She is not ill-appearing.  HENT:     Head: Normocephalic. Laceration present.   Cardiovascular:     Rate and Rhythm: Normal rate.  Pulmonary:     Effort: Pulmonary effort is normal. No respiratory distress.  Skin:    General: Skin is warm and dry.     Capillary Refill: Capillary refill takes less than 2 seconds.  Neurological:     General: No focal deficit present.     Mental Status: She  is alert and oriented to person, place, and time.  Psychiatric:        Mood and Affect: Mood normal.        Behavior: Behavior normal.     Procedures  No results found for this or any previous visit (from the past 24 hours).  No results found.   Assessment and Plan :     Discharge Instructions       1. Visit for suture removal (Primary) - bacitracin  ointment applied to suture site after removal - Remove sutures placed in left posterior scalp, 11 sutures removed by provider, no secondary signs of infection, no complications.      Kenedy Haisley B Dnya Hickle   Signa Cheek, [place], [place] 11/11/23 1343

## 2023-11-11 NOTE — ED Notes (Signed)
 Sutures removed by provider

## 2023-11-11 NOTE — ED Triage Notes (Signed)
 Reports to UC for suture removal of the scalp.

## 2023-12-19 ENCOUNTER — Encounter (HOSPITAL_COMMUNITY): Payer: Self-pay

## 2023-12-19 ENCOUNTER — Observation Stay (HOSPITAL_COMMUNITY)
Admission: EM | Admit: 2023-12-19 | Discharge: 2023-12-20 | Disposition: A | Discharge status: Home / Self Care | Attending: Internal Medicine | Admitting: Internal Medicine

## 2023-12-19 ENCOUNTER — Other Ambulatory Visit: Payer: Self-pay

## 2023-12-19 ENCOUNTER — Emergency Department (HOSPITAL_COMMUNITY)

## 2023-12-19 ENCOUNTER — Observation Stay (HOSPITAL_COMMUNITY)

## 2023-12-19 DIAGNOSIS — R296 Repeated falls: Secondary | ICD-10-CM

## 2023-12-19 DIAGNOSIS — S0990XA Unspecified injury of head, initial encounter: Secondary | ICD-10-CM

## 2023-12-19 DIAGNOSIS — S0636AA Traumatic hemorrhage of cerebrum, unspecified, with loss of consciousness status unknown, initial encounter: Principal | ICD-10-CM

## 2023-12-19 DIAGNOSIS — W19XXXA Unspecified fall, initial encounter: Secondary | ICD-10-CM | POA: Insufficient documentation

## 2023-12-19 DIAGNOSIS — S0101XA Laceration without foreign body of scalp, initial encounter: Secondary | ICD-10-CM | POA: Diagnosis not present

## 2023-12-19 DIAGNOSIS — S0003XA Contusion of scalp, initial encounter: Secondary | ICD-10-CM | POA: Diagnosis not present

## 2023-12-19 DIAGNOSIS — S0634AA Traumatic hemorrhage of right cerebrum with loss of consciousness status unknown, initial encounter: Secondary | ICD-10-CM | POA: Diagnosis not present

## 2023-12-19 DIAGNOSIS — S066X0A Traumatic subarachnoid hemorrhage without loss of consciousness, initial encounter: Principal | ICD-10-CM | POA: Insufficient documentation

## 2023-12-19 DIAGNOSIS — I609 Nontraumatic subarachnoid hemorrhage, unspecified: Secondary | ICD-10-CM | POA: Diagnosis not present

## 2023-12-19 DIAGNOSIS — Z96643 Presence of artificial hip joint, bilateral: Secondary | ICD-10-CM | POA: Diagnosis not present

## 2023-12-19 DIAGNOSIS — I10 Essential (primary) hypertension: Secondary | ICD-10-CM | POA: Diagnosis present

## 2023-12-19 DIAGNOSIS — Z6830 Body mass index (BMI) 30.0-30.9, adult: Secondary | ICD-10-CM | POA: Insufficient documentation

## 2023-12-19 DIAGNOSIS — M47812 Spondylosis without myelopathy or radiculopathy, cervical region: Secondary | ICD-10-CM | POA: Diagnosis not present

## 2023-12-19 DIAGNOSIS — E66811 Obesity, class 1: Secondary | ICD-10-CM | POA: Insufficient documentation

## 2023-12-19 DIAGNOSIS — I771 Stricture of artery: Secondary | ICD-10-CM | POA: Diagnosis not present

## 2023-12-19 DIAGNOSIS — Z043 Encounter for examination and observation following other accident: Secondary | ICD-10-CM | POA: Diagnosis not present

## 2023-12-19 DIAGNOSIS — R9082 White matter disease, unspecified: Secondary | ICD-10-CM | POA: Diagnosis not present

## 2023-12-19 DIAGNOSIS — M8580 Other specified disorders of bone density and structure, unspecified site: Secondary | ICD-10-CM | POA: Diagnosis not present

## 2023-12-19 DIAGNOSIS — R457 State of emotional shock and stress, unspecified: Secondary | ICD-10-CM | POA: Diagnosis not present

## 2023-12-19 DIAGNOSIS — R58 Hemorrhage, not elsewhere classified: Secondary | ICD-10-CM | POA: Diagnosis not present

## 2023-12-19 DIAGNOSIS — Z79899 Other long term (current) drug therapy: Secondary | ICD-10-CM | POA: Diagnosis not present

## 2023-12-19 DIAGNOSIS — J42 Unspecified chronic bronchitis: Secondary | ICD-10-CM | POA: Diagnosis not present

## 2023-12-19 LAB — COMPREHENSIVE METABOLIC PANEL WITH GFR
ALT: 8 U/L (ref 0–44)
AST: 18 U/L (ref 15–41)
Albumin: 4 g/dL (ref 3.5–5.0)
Alkaline Phosphatase: 65 U/L (ref 38–126)
Anion gap: 10 (ref 5–15)
BUN: 13 mg/dL (ref 8–23)
CO2: 26 mmol/L (ref 22–32)
Calcium: 8.9 mg/dL (ref 8.9–10.3)
Chloride: 107 mmol/L (ref 98–111)
Creatinine, Ser: 0.9 mg/dL (ref 0.44–1.00)
GFR, Estimated: >60 mL/min (ref >60)
Glucose, Bld: 128 mg/dL — ABNORMAL HIGH (ref 70–99)
Potassium: 3.8 mmol/L (ref 3.5–5.1)
Sodium: 143 mmol/L (ref 135–145)
Total Bilirubin: 0.4 mg/dL (ref 0.0–1.2)
Total Protein: 6.4 g/dL — ABNORMAL LOW (ref 6.5–8.1)

## 2023-12-19 LAB — URINALYSIS, ROUTINE W REFLEX MICROSCOPIC
Bacteria, UA: NONE SEEN
Bilirubin Urine: NEGATIVE
Glucose, UA: NEGATIVE mg/dL
Ketones, ur: NEGATIVE mg/dL
Nitrite: NEGATIVE
Protein, ur: NEGATIVE mg/dL
Specific Gravity, Urine: 1.013 (ref 1.005–1.030)
pH: 6 (ref 5.0–8.0)

## 2023-12-19 LAB — CBC WITH DIFFERENTIAL/PLATELET
Abs Immature Granulocytes: 0.02 K/uL (ref 0.00–0.07)
Basophils Absolute: 0.1 K/uL (ref 0.0–0.1)
Basophils Relative: 1 %
Eosinophils Absolute: 0.2 K/uL (ref 0.0–0.5)
Eosinophils Relative: 2 %
HCT: 37.9 % (ref 36.0–46.0)
Hemoglobin: 12.3 g/dL (ref 12.0–15.0)
Immature Granulocytes: 0 %
Lymphocytes Relative: 15 %
Lymphs Abs: 1.4 K/uL (ref 0.7–4.0)
MCH: 31.1 pg (ref 26.0–34.0)
MCHC: 32.5 g/dL (ref 30.0–36.0)
MCV: 95.9 fL (ref 80.0–100.0)
Monocytes Absolute: 0.7 K/uL (ref 0.1–1.0)
Monocytes Relative: 8 %
Neutro Abs: 6.6 K/uL (ref 1.7–7.7)
Neutrophils Relative %: 74 %
Platelets: 268 K/uL (ref 150–400)
RBC: 3.95 MIL/uL (ref 3.87–5.11)
RDW: 13.4 % (ref 11.5–15.5)
WBC: 9 K/uL (ref 4.0–10.5)
nRBC: 0 % (ref 0.0–0.2)

## 2023-12-19 LAB — CK: Total CK: 141 U/L (ref 38–234)

## 2023-12-19 MED ORDER — LIDOCAINE-EPINEPHRINE-TETRACAINE (LET) TOPICAL GEL
3.0000 mL | Freq: Once | TOPICAL | Status: AC
  Administered 2023-12-19: 3 mL via TOPICAL
  Filled 2023-12-19: qty 3

## 2023-12-19 MED ORDER — METOPROLOL TARTRATE 50 MG PO TABS
100.0000 mg | ORAL_TABLET | Freq: Two times a day (BID) | ORAL | Status: DC
  Administered 2023-12-20 (×2): 100 mg via ORAL
  Filled 2023-12-19 (×2): qty 2

## 2023-12-19 MED ORDER — DILTIAZEM HCL ER COATED BEADS 180 MG PO CP24
180.0000 mg | ORAL_CAPSULE | Freq: Every day | ORAL | Status: DC
  Administered 2023-12-20: 180 mg via ORAL
  Filled 2023-12-19: qty 1

## 2023-12-19 MED ORDER — LIDOCAINE-EPINEPHRINE (PF) 2 %-1:200000 IJ SOLN
10.0000 mL | Freq: Once | INTRAMUSCULAR | Status: DC
  Filled 2023-12-19: qty 20

## 2023-12-19 MED ORDER — ASPIRIN 81 MG PO TBEC
81.0000 mg | DELAYED_RELEASE_TABLET | Freq: Every day | ORAL | Status: DC
  Administered 2023-12-20: 81 mg via ORAL
  Filled 2023-12-19: qty 1

## 2023-12-19 NOTE — Consult Note (Signed)
 I was contacted by Dr. Towana regarding this patient.  She has a 89-year-old white female was not anticoagulated. She took a fall today.  CT scans of the head and neck were obtained.  This demonstrated an intracranial hemorrhage.  I have reviewed the patient's   Head CT.  It demonstrates small traumatic subarachnoid hemorrhages and some blood in the temporal horn of the right lateral ventricle.  There is no mass effect.  Her cervical CT demonstrates degenerative changes.    I recommended planning to repeat her CT scan in 6 hours or tomorrow morning if she is admitted.  I doubt she would require neurosurgical intervention.  Please call if I can be of further assistance or the CT scan demonstrates worsening of her hemorrhages.

## 2023-12-19 NOTE — ED Provider Notes (Signed)
 Signout from Dr. Jakie.  89 year old female who had a fall at home, signs of head injury.  Unfortunately CT shows some intracranial bleeding.  She is not on any blood thinners.  She is otherwise awake and alert.  Awaiting callback from neurosurgery for recommendations. Physical Exam  BP (!) 164/71   Pulse 72   Temp 97.6 F (36.4 C) (Oral)   Resp 15   Ht 5' 5 (1.651 m)   Wt 77.1 kg   SpO2 98%   BMI 28.29 kg/m   Physical Exam  Procedures  Procedures  ED Course / MDM   Clinical Course as of 12/19/23 1651  Fri Dec 19, 2023  1638 Signed out to Dr Towana [RP]    Clinical Course User Index [RP] Yolande Lamar BROCKS, MD   Medical Decision Making Amount and/or Complexity of Data Reviewed Labs: ordered. Radiology: ordered.   4:50 PM.  Reviewed case with Dr. Mavis neurosurgery.  He said he did not see any significant reason for her to be transferred to [facility] at this time.  Would recommend repeat CAT scan either in 6 hours or early in the morning to ensure that it is stable.  If things are worsening please reconsult him.  540pm Discussed with triad hospitalist Dr Barbra.      Towana Ozell BROCKS, MD 12/20/23 [phone]

## 2023-12-19 NOTE — ED Triage Notes (Addendum)
 BIB [facility]. Friend found pt on the ground at home. Pt states she did not remember how she got there. Pt states she is not on thinners. Pt hypertensive with EMS 180's systolic. Lac on back of head, bleeding controlled. Hx of forgetfulness as was told by EMS; this is not a new finding and pt's memory has been on a decline.

## 2023-12-19 NOTE — ED Provider Notes (Signed)
 I was asked by attending to repair scalp laceration.  This was my only involvement with patient.  She has a 3 cm laceration occipital scalp, hemostatic.  No palpable skull disruption.  LACERATION REPAIR Performed by: Mliss Narrow Authorized by: Mliss Narrow Consent: Verbal consent obtained. Risks and benefits: risks, benefits and alternatives were discussed Consent given by: patient Patient identity confirmed: provided demographic data Prepped and Draped in normal sterile fashion Wound explored  Laceration Location: occipital scalp  Laceration Length: 3cm  No Foreign Bodies seen or palpated  Anesthesia: n/a  Local anesthetic: topical LET Anesthetic total: 4 ml  Irrigation method: NS scrub followed by betadine  Amount of cleaning: standard  Skin closure: staples  Number of sutures: 6 staples  Technique: staple  Patient tolerance: Patient tolerated the procedure well with no immediate complications.    Narrow Mliss, PA-C 12/19/23 1656    Tracy Lamar BROCKS, MD 12/19/23 [phone]

## 2023-12-19 NOTE — ED Provider Notes (Signed)
 [place] EMERGENCY DEPARTMENT AT [facility] Provider Note   CSN: [id number] Arrival date & time: 12/19/23  1230     Patient presents with: Fall and Laceration   [name] is a 89 y.o. female.   89 year old female presents emergency department after a fall.  History obtained per the patient as well as EMS and her friend Lynwood Queen.  Apparently Lynwood went to check on her and found her in on the floor.  Had some bleeding from the back of her head.  Had some dried blood that looked like she had been down on the ground for at least 2 hours.  Patient reports she was going in the kitchen to grab a Coke when she slipped on the floor and hit her head.  Denies prolonged downtime.  EMS was also concerned that she may have been down longer than just a few minutes.  Not on blood thinners.  Compression dressing placed prior to arrival.  Patient denies any pain elsewhere.  Lives at home by herself       Prior to Admission medications   Medication Sig Start Date End Date Taking? Authorizing Provider  aspirin  EC 81 MG tablet Take 1 tablet (81 mg total) by mouth daily. Swallow whole. 07/16/22   Ricky Fines, MD  diltiazem  (CARDIZEM  CD) 180 MG 24 hr capsule Take 1 capsule (180 mg total) by mouth daily. 07/16/22   Ricky Fines, MD  metoprolol  tartrate (LOPRESSOR ) 100 MG tablet Take 1 tablet (100 mg total) by mouth 2 (two) times daily. 07/15/22   Ricky Fines, MD    Allergies: Patient has no known allergies.    Review of Systems  Updated Vital Signs BP (!) 166/69   Pulse 81   Temp 97.6 F (36.4 C) (Oral)   Resp (!) 22   Ht 5' 5 (1.651 m)   Wt 77.1 kg   SpO2 96%   BMI 28.29 kg/m   Physical Exam Vitals and nursing note reviewed.  Constitutional:      General: She is not in acute distress.    Appearance: She is well-developed.     Comments: Alert and oriented to self and place.  Not year  HENT:     Head: Normocephalic.     Comments: Dried blood to the back of the  head    Right Ear: External ear normal.     Left Ear: External ear normal.     Nose: Nose normal.  Eyes:     Extraocular Movements: Extraocular movements intact.     Conjunctiva/sclera: Conjunctivae normal.     Pupils: Pupils are equal, round, and reactive to light.  Cardiovascular:     Rate and Rhythm: Normal rate and regular rhythm.     Heart sounds: No murmur heard. Pulmonary:     Effort: Pulmonary effort is normal. No respiratory distress.     Breath sounds: Normal breath sounds.  Abdominal:     General: Abdomen is flat. There is no distension.     Palpations: Abdomen is soft. There is no mass.     Tenderness: There is no abdominal tenderness. There is no guarding.  Musculoskeletal:     Cervical back: Normal range of motion and neck supple.     Right lower leg: No edema.     Left lower leg: No edema.     Comments: No tenderness to palpation in the C-spine, thoracic spine, or lumbar spine.  No step-offs noted.  No tenderness to palpation of bilateral  shoulders, elbows, wrists, hips, knees, or ankles.  Full range of motion of bilateral shoulders and hips.  Skin:    General: Skin is warm and dry.  Neurological:     Mental Status: She is alert. Mental status is at baseline.     Cranial Nerves: No cranial nerve deficit.     Sensory: No sensory deficit.     Motor: No weakness.  Psychiatric:        Mood and Affect: Mood normal.     (all labs ordered are listed, but only abnormal results are displayed) Labs Reviewed  COMPREHENSIVE METABOLIC PANEL WITH GFR - Abnormal; Notable for the following components:      Result Value   Glucose, Bld 128 (*)    Total Protein 6.4 (*)    All other components within normal limits  URINALYSIS, ROUTINE W REFLEX MICROSCOPIC - Abnormal; Notable for the following components:   APPearance HAZY (*)    Hgb urine dipstick MODERATE (*)    Leukocytes,Ua SMALL (*)    All other components within normal limits  CBC WITH DIFFERENTIAL/PLATELET  CK     EKG: EKG Interpretation Date/Time:  Friday December 19 2023 12:43:21 EDT Ventricular Rate:  77 PR Interval:    QRS Duration:  83 QT Interval:  376 QTC Calculation: 406 R Axis:   23  Text Interpretation: Atrial fibrillation Low voltage, precordial leads Borderline T abnormalities, anterior leads Confirmed by Yolande [place] [phone]) on 12/19/2023 3:14:01 PM  Radiology: CT Cervical Spine Wo Contrast Result Date: 12/19/2023 CLINICAL DATA:  Fall EXAM: CT CERVICAL SPINE WITHOUT CONTRAST TECHNIQUE: Multidetector CT imaging of the cervical spine was performed without intravenous contrast. Multiplanar CT image reconstructions were also generated. RADIATION DOSE REDUCTION: This exam was performed according to the departmental dose-optimization program which includes automated exposure control, adjustment of the mA and/or kV according to patient size and/or use of iterative reconstruction technique. COMPARISON:  November 01, 2023 FINDINGS: No significant alignment abnormality. The craniocervical junction is normal. The atlantoaxial articulation is normal. The dens is normal. There is no fracture identified. Other comments: Cervical spondylosis IMPRESSION: Cervical spondylosis.  No fracture. Electronically Signed   By: Nancyann Burns M.D.   On: 12/19/2023 16:17   CT Head Wo Contrast Result Date: 12/19/2023 CLINICAL DATA:  Fall EXAM: CT HEAD WITHOUT CONTRAST TECHNIQUE: Contiguous axial images were obtained from the base of the skull through the vertex without intravenous contrast. RADIATION DOSE REDUCTION: This exam was performed according to the departmental dose-optimization program which includes automated exposure control, adjustment of the mA and/or kV according to patient size and/or use of iterative reconstruction technique. COMPARISON:  Head CT November 01, 2023, MRI March 28, 2023 FINDINGS: CT HEAD: There are small bifrontal convexity hemorrhages. There is a hemorrhage in the temporal horn of the  right lateral ventricle. There is chronic white matter disease No acute ischemic changes. No mass lesion. Skull/sinuses/orbits: No significant abnormality. IMPRESSION: Small bilateral frontal convexity hemorrhages. Hemorrhage in the temporal horn of the right lateral ventricle. Electronically Signed   By: Nancyann Burns M.D.   On: 12/19/2023 16:15   DG Chest Portable 1 View Result Date: 12/19/2023 CLINICAL DATA:  Clem[place]. EXAM: PORTABLE CHEST 1 VIEW COMPARISON:  07/13/2022 FINDINGS: The heart is within normal limits in size given the patient's age and the AP projection and portable technique. The mediastinal and hilar contours are normal. Stable tortuosity of the thoracic aorta. No acute pulmonary findings. Stable underlying mild chronic bronchitic type interstitial changes. No pleural effusions  or pneumothorax. IMPRESSION: No acute cardiopulmonary findings. Electronically Signed   By: MYRTIS Stammer M.D.   On: 12/19/2023 14:50   DG Pelvis Portable Result Date: 12/19/2023 CLINICAL DATA:  Clemens EXAM: PORTABLE PELVIS 1-2 VIEWS COMPARISON:  11/01/2023 FINDINGS: 3 supine frontal views of the pelvis are obtained. Bilateral hip arthroplasties are identified in stable position, with no evidence of failure or loosening. The bones are osteopenic. No acute displaced fractures. Sacroiliac joints are unremarkable. Stable postsurgical changes lower lumbar spine. IMPRESSION: 1. No acute displaced fracture. 2. Stable bilateral hip arthroplasties. Electronically Signed   By: Ozell Daring M.D.   On: 12/19/2023 14:49     Procedures   Medications Ordered in the ED  lidocaine -EPINEPHrine -tetracaine  (LET) topical gel (3 mLs Topical Given 12/19/23 1616)    Clinical Course as of 12/19/23 1812  Fri Dec 19, 2023  1638 Signed out to Dr Towana [RP]    Clinical Course User Index [RP] Yolande Lamar BROCKS, MD                                 Medical Decision Making Amount and/or Complexity of Data Reviewed Labs:  ordered. Radiology: ordered.  Risk Prescription drug management. Decision regarding hospitalization.   89 year old female presents emergency department after a fall.  History obtained per the patient as well as EMS and her friend Lynwood Queen.   Initial Ddx:  Fall, TBI, C-spine injury, hip fracture, rib fracture, rhabdomyolysis  MDM/Course:  Patient is into the emergency department after a fall.  It is unclear exactly how long she was down for.  There are some reports that she may have had some cognitive decline recently and also lives alone.  Has an obvious head injury.  See PA note for laceration repair.  Mildly hypertensive on arrival but blood pressure improved to the 150s without any intervention.  She underwent a head CT which does show frontal convexity and temporal lobe hemorrhage that is small.  Not on any anticoagulation to reverse at this point in time.  Blood pressures currently within goal.  Neurosurgery consulted and awaiting their response at this point in time.  Chest x-ray and pelvis x-ray without any fractures.  Blood work is unremarkable.  Signed out to the oncoming physician awaiting neurosurgery recommendations.    This patient presents to the ED for concern of complaints listed in HPI, this involves an extensive number of treatment options, and is a complaint that carries with it a high risk of complications and morbidity. Disposition including potential need for admission considered.   Dispo: Admit to Floor  Additional history obtained from EMS Records reviewed Outpatient Clinic Notes The following labs were independently interpreted: Chemistry and show no acute abnormality I independently reviewed the following imaging with scope of interpretation limited to determining acute life threatening conditions related to emergency care: CT Head and agree with the radiologist interpretation with the following exceptions: none I personally reviewed and interpreted cardiac  monitoring: normal sinus rhythm  I personally reviewed and interpreted the pt's EKG: see above for interpretation  I have reviewed the patients home medications and made adjustments as needed Consults: Neurosurgery Social Determinants of health:  Geriatric  CRITICAL CARE Performed by: Lamar BROCKS Yolande   Total critical care time: 30 minutes  Critical care time was exclusive of separately billable procedures and treating other patients.  Critical care was necessary to treat or prevent imminent or life-threatening deterioration.  Critical care  was time spent personally by me on the following activities: development of treatment plan with patient and/or surrogate as well as nursing, discussions with consultants, evaluation of patient's response to treatment, examination of patient, obtaining history from patient or surrogate, ordering and performing treatments and interventions, ordering and review of laboratory studies, ordering and review of radiographic studies, pulse oximetry and re-evaluation of patient's condition.   Portions of this note were generated with Scientist, clinical (histocompatibility and immunogenetics). Dictation errors may occur despite best attempts at proofreading.     Final diagnoses:  Fall, initial encounter  Traumatic injury of head, initial encounter  Traumatic intracerebral hemorrhage with unknown loss of consciousness status, unspecified laterality, initial encounter [facility])  Laceration of scalp, initial encounter    ED Discharge Orders     None          Yolande Lamar BROCKS, MD 12/19/23 [phone]

## 2023-12-19 NOTE — H&P (Signed)
 History and Physical    Patient: [name] FMW:[id number] DOB: [birth year] DOA: 12/19/2023 DOS: the patient was seen and examined on 12/19/2023 PCP: Marvine Rush, MD  Patient coming from: Home  Chief Complaint:  Chief Complaint  Patient presents with   Fall   Laceration   HPI: [name] is a 89 y.o. female with medical history significant of hypertension, degenerative disc disease, arthritis.  Patient lives alone at home and is seen today due to a fall at home.  She has fallen a couple of times over the past couple of months.  Today she was brought to the hospital by EMS after a neighbor found her on the floor.  Her neighbor checks on her a couple times a day to make sure that she is okay.  When she was found on the ground, EMS was called and the patient was brought to the hospital.  On evaluation here, she was found to have a head laceration.  A CT of the head was done which showed a small subarachnoid hemorrhage with some blood in the right horn of the ventricle.  She has no neurodeficits.  There is no mass effect.  Her blood pressure is stable.  Neurosurgery was consulted who recommended a repeat CT scan in 6 hours to ensure that there is no worsening hemorrhage.  At this point, because of her stable status and stable neuroexam, she was felt to be stable here at our hospital.  Review of Systems: As mentioned in the history of present illness. All other systems reviewed and are negative. Past Medical History:  Diagnosis Date   Bilateral hip joint arthritis 09/06/2013   Degeneration of intervertebral disc of lumbar region 09/06/2013   Hypertension    Past Surgical History:  Procedure Laterality Date   ABDOMINAL HYSTERECTOMY     APPENDECTOMY     BREAST BIOPSY     CATARACT EXTRACTION W/PHACO Right 09/03/2012   Procedure: CATARACT EXTRACTION PHACO AND INTRAOCULAR LENS PLACEMENT (IOC);  Surgeon: Cherene Mania, MD;  Location: [facility];  Service: Ophthalmology;  Laterality: Right;   CDE: 12.37   CATARACT EXTRACTION W/PHACO Left 09/28/2012   Procedure: CATARACT EXTRACTION PHACO AND INTRAOCULAR LENS PLACEMENT (IOC);  Surgeon: Cherene Mania, MD;  Location: [facility];  Service: Ophthalmology;  Laterality: Left;  CDE 18.52   closed redution external fixation right wrist Right    INSERTION OF MESH N/A 06/30/2015   Procedure: INSERTION OF MESH;  Surgeon: Oneil Budge, MD;  Location: [facility];  Service: General;  Laterality: N/A;   JOINT REPLACEMENT     bilateral Hips at [facility] FUSION     Dr. Gaither   TONSILLECTOMY     UMBILICAL HERNIA REPAIR N/A 06/30/2015   Procedure: UMBILICAL HERNIORRHAPHY WITH MESH;  Surgeon: Oneil Budge, MD;  Location: [facility];  Service: General;  Laterality: N/A;   Social History:  reports that she has never smoked. She has never used smokeless tobacco. She reports that she does not drink alcohol and does not use drugs.  No Known Allergies  History reviewed. No pertinent family history.  Prior to Admission medications   Medication Sig Start Date End Date Taking? Authorizing Provider  aspirin  EC 81 MG tablet Take 1 tablet (81 mg total) by mouth daily. Swallow whole. 07/16/22   Ricky Fines, MD  diltiazem  (CARDIZEM  CD) 180 MG 24 hr capsule Take 1 capsule (180 mg total) by mouth daily. 07/16/22   Ricky Fines, MD  metoprolol  tartrate (LOPRESSOR ) 100 MG  tablet Take 1 tablet (100 mg total) by mouth 2 (two) times daily. 07/15/22   Ricky Fines, MD    Physical Exam: Vitals:   12/19/23 1730 12/19/23 1745 12/19/23 1800 12/19/23 1830  BP: (!) 151/100  (!) 166/69 (!) 167/118  Pulse: 67 81 81 79  Resp: 16 20 (!) 22 (!) 22  Temp:      TempSrc:      SpO2: 94% 97% 96% 91%  Weight:      Height:       General: Elderly female. Awake and alert and oriented x3. No acute cardiopulmonary distress.  HEENT: Normocephalic atraumatic.  Right and left ears normal in appearance.  Pupils equal, round, reactive to light. Extraocular muscles are intact. Sclerae  anicteric and noninjected.  Moist mucosal membranes. No mucosal lesions.  Neck: Neck supple without lymphadenopathy. No carotid bruits. No masses palpated.  Cardiovascular: Regular rate with normal S1-S2 sounds. No murmurs, rubs, gallops auscultated. No JVD.  Respiratory: Good respiratory effort with no wheezes, rales, rhonchi. Lungs clear to auscultation bilaterally.  No accessory muscle use. Abdomen: Soft, nontender, nondistended. Active bowel sounds. No masses or hepatosplenomegaly  Skin: No rashes, or ulcerations.  Scalp laceration, which is repaired. Dry, warm to touch. 2+ dorsalis pedis and radial pulses. Musculoskeletal: No calf or leg pain. All major joints not erythematous nontender.  No upper or lower joint deformation.  Good ROM.  No contractures  Psychiatric: Intact judgment and insight. Pleasant and cooperative. Neurologic: No focal neurological deficits. Strength is 5/5 and symmetric in upper and lower extremities.  Cranial nerves II through XII are grossly intact.   Data Reviewed: Labs and images reviewed by me.  Assessment and Plan: No notes have been filed under this hospital service. Service: Hospitalist  Principal Problem:   Subarachnoid bleed (HCC) Active Problems:   Essential hypertension   Multiple falls  Subarachnoid bleed  repeat CT scan in 6 hours.  Will check CBC to ensure no massive blood loss.   From a neurological standpoint, the patient is stable and I feel that the patient can be safely managed here, provided the repeat CT scan in 6 hours is normal. Multiple falls. PT and OT evaluation.  Will consult transition of care.  Patient is at high risk of a subsequent fall with major brain bleed.  We discussed this risk.  Ideally, the patient should be in an assisted living facility.  May need assistive walking device such as a rolling walker.  Currently she walks with a cane.  Obviously this is not sufficient Hypertension Blood pressure control   Advance Care  Planning:   Code Status: Full Code confirmed by patient  Consults: Telephone consult by neurosurgery provided.  Note is in the chart  Family Communication: Patient's neighbor was present during interview and exam as requested by the patient  Severity of Illness: The appropriate patient status for this patient is OBSERVATION. Observation status is judged to be reasonable and necessary in order to provide the required intensity of service to ensure the patient's safety. The patient's presenting symptoms, physical exam findings, and initial radiographic and laboratory data in the context of their medical condition is felt to place them at decreased risk for further clinical deterioration. Furthermore, it is anticipated that the patient will be medically stable for discharge from the hospital within 2 midnights of admission.   Author: Catalena Stanhope J Ziair Penson, DO 12/19/2023 6:59 PM  For on call review [email or web address].

## 2023-12-19 NOTE — Progress Notes (Signed)
 Patient ID: [name], female   DOB: 02-22-1932, 89 y.o.   MRN: [id number]   I have reviewed the patient's follow-up head CT performed at 9:16 p.m..  It demonstrates no significant change in her small bilateral subarachnoid hemorrhages and right interventricular hemorrhage.  I will sign off.   No neurosurgical follow-up is needed unless clinically indicated. Please call if I can be of further assistance.

## 2023-12-20 ENCOUNTER — Encounter (HOSPITAL_COMMUNITY): Payer: Self-pay | Admitting: Family Medicine

## 2023-12-20 DIAGNOSIS — R296 Repeated falls: Secondary | ICD-10-CM

## 2023-12-20 DIAGNOSIS — S0636AA Traumatic hemorrhage of cerebrum, unspecified, with loss of consciousness status unknown, initial encounter: Secondary | ICD-10-CM | POA: Diagnosis not present

## 2023-12-20 DIAGNOSIS — I10 Essential (primary) hypertension: Secondary | ICD-10-CM | POA: Diagnosis not present

## 2023-12-20 DIAGNOSIS — S0990XA Unspecified injury of head, initial encounter: Secondary | ICD-10-CM

## 2023-12-20 LAB — CBC
HCT: 32.5 % — ABNORMAL LOW (ref 36.0–46.0)
HCT: 33.9 % — ABNORMAL LOW (ref 36.0–46.0)
Hemoglobin: 10.9 g/dL — ABNORMAL LOW (ref 12.0–15.0)
Hemoglobin: 11.4 g/dL — ABNORMAL LOW (ref 12.0–15.0)
MCH: 31.7 pg (ref 26.0–34.0)
MCH: 31.9 pg (ref 26.0–34.0)
MCHC: 33.5 g/dL (ref 30.0–36.0)
MCHC: 33.6 g/dL (ref 30.0–36.0)
MCV: 94.2 fL (ref 80.0–100.0)
MCV: 95 fL (ref 80.0–100.0)
Platelets: 245 K/uL (ref 150–400)
Platelets: 275 K/uL (ref 150–400)
RBC: 3.42 MIL/uL — ABNORMAL LOW (ref 3.87–5.11)
RBC: 3.6 MIL/uL — ABNORMAL LOW (ref 3.87–5.11)
RDW: 13.2 % (ref 11.5–15.5)
RDW: 13.3 % (ref 11.5–15.5)
WBC: 8.3 K/uL (ref 4.0–10.5)
WBC: 9.4 K/uL (ref 4.0–10.5)
nRBC: 0 % (ref 0.0–0.2)
nRBC: 0 % (ref 0.0–0.2)

## 2023-12-20 LAB — BASIC METABOLIC PANEL WITH GFR
Anion gap: 9 (ref 5–15)
BUN: 10 mg/dL (ref 8–23)
CO2: 25 mmol/L (ref 22–32)
Calcium: 8.9 mg/dL (ref 8.9–10.3)
Chloride: 107 mmol/L (ref 98–111)
Creatinine, Ser: 0.73 mg/dL (ref 0.44–1.00)
GFR, Estimated: >60 mL/min (ref >60)
Glucose, Bld: 96 mg/dL (ref 70–99)
Potassium: 3.7 mmol/L (ref 3.5–5.1)
Sodium: 141 mmol/L (ref 135–145)

## 2023-12-20 MED ORDER — ACETAMINOPHEN 500 MG PO TABS: 1000.0000 mg | ORAL_TABLET | Freq: Three times a day (TID) | ORAL | Status: AC | PRN

## 2023-12-20 MED ORDER — ASPIRIN 81 MG PO TBEC: 81.0000 mg | DELAYED_RELEASE_TABLET | Freq: Every day | ORAL | Status: AC

## 2023-12-20 MED ORDER — MELATONIN 3 MG PO TABS
3.0000 mg | ORAL_TABLET | Freq: Once | ORAL | Status: AC
  Administered 2023-12-20: 3 mg via ORAL
  Filled 2023-12-20: qty 1

## 2023-12-20 NOTE — Discharge Summary (Signed)
 Physician Discharge Summary   Patient: [name] MRN: [id number] DOB: 03-08-1931  Admit date:     12/19/2023  Discharge date: 12/20/23  Discharge Physician: Eric Nunnery   PCP: Marvine Rush, MD   Recommendations at discharge:  Repeat CBC to follow hemoglobin trend/stability Repeat basic metabolic panel to follow electrolytes and renal function Reassess blood pressure and adjust antihypertensive as needed Goals of care discussion and advance care planning recommended.  Discharge Diagnoses: Principal Problem:   Subarachnoid bleed [facility]) Active Problems:   Essential hypertension   Multiple falls   Traumatic intracerebral hemorrhage with unknown loss of consciousness status [facility])   Head trauma  Brief Hospital admission narrative: As per H&P written by Dr. Barbra on 12/19/2023  [name] is a 89 y.o. female with medical history significant of hypertension, degenerative disc disease, arthritis.  Patient lives alone at home and is seen today due to a fall at home.  She has fallen a couple of times over the past couple of months.  Today she was brought to the hospital by EMS after a neighbor found her on the floor.  Her neighbor checks on her a couple times a day to make sure that she is okay.  When she was found on the ground, EMS was called and the patient was brought to the hospital.  On evaluation here, she was found to have a head laceration.  A CT of the head was done which showed a small subarachnoid hemorrhage with some blood in the right horn of the ventricle.  She has no neurodeficits.  There is no mass effect.  Her blood pressure is stable.  Neurosurgery was consulted who recommended a repeat CT scan in 6 hours to ensure that there is no worsening hemorrhage.  At this point, because of her stable status and stable neuroexam, she was felt to be stable here at our hospital.   Assessment and Plan: 1-subarachnoid bleed - Patient denies headaches or any focal neurologic  deficit - Repeat CT scan demonstrating stable intracranial bleed without any further expansion or midline shift. - Per neurosurgery recommendations patient safe to be discharged without any further interventions needed. - Aspirin  is going to be on hold for 10 days; after that safe to be resumed - Patient educated about avoiding the use of NSAIDs - Continue Tylenol  for any pain discomfort.  2-multiple falls - Appreciate PT/OT evaluation - Patient declined home health services but was in agreement to receive ambulatory referral for outpatient PT/OT. -educated about the usage of a walker as assistance walking device to minimize future falls.  3-hypertension - Resume home antihypertensive agents - Heart healthy/low-sodium diet discussed with patient.  4-class I obesity - Low-calorie diet and portion control discussed with patient. -Body mass index is 30.85 kg/m.    Consultants: Neurosurgery (Dr. Reyes Budge). Procedures performed: See below for x-ray reports. Disposition: Home with ambulatory referral for outpatient PT/OT. Diet recommendation: Heart healthy/low-sodium diet.  DISCHARGE MEDICATION: Allergies as of 12/20/2023   No Known Allergies      Medication List     TAKE these medications    acetaminophen  500 MG tablet Commonly known as: TYLENOL  Take 2 tablets (1,000 mg total) by mouth every 8 (eight) hours as needed for mild pain (pain score 1-3), fever or headache.   aspirin  EC 81 MG tablet Take 1 tablet (81 mg total) by mouth daily. Hold this medication for 10 days; once you resume taking it make sure that you swallow whole. Start taking on:  December 30, 2023 What changed:  additional instructions These instructions start on December 30, 2023. If you are unsure what to do until then, ask your doctor or other care provider.   diltiazem  180 MG 24 hr capsule Commonly known as: CARDIZEM  CD Take 1 capsule (180 mg total) by mouth daily.   metoprolol  tartrate 100 MG  tablet Commonly known as: LOPRESSOR  Take 1 tablet (100 mg total) by mouth 2 (two) times daily.        Follow-up Information     Marvine Rush, MD. Schedule an appointment as soon as possible for a visit in 10 day(s).   Specialty: Family Medicine Contact information: [address] [place] [phone]                Discharge Exam: [name]   12/19/23 1236 12/20/23 0204  Weight: 77.1 kg 84.1 kg   General exam: Alert, awake, oriented x 3; no major distress.  Feeling ready to go home. Respiratory system: Good saturation on room air; no using accessory muscles.  Normal respiratory effort. Cardiovascular system: Rate controlled, no rubs, no gallops, no JVD on exam. Gastrointestinal system: Abdomen is nondistended, soft and nontender. No organomegaly or masses felt. Normal bowel sounds heard. Central nervous system: No focal neurological deficits. Extremities: No cyanosis or clubbing. Skin: No petechiae. Psychiatry: Judgement and insight appear normal. Mood & affect appropriate.   Condition at discharge: Stable and improved.  The results of significant diagnostics from this hospitalization (including imaging, microbiology, ancillary and laboratory) are listed below for reference.   Imaging Studies: CT HEAD WO CONTRAST ( ) Result Date: 12/19/2023 EXAM: CT HEAD WITHOUT CONTRAST 12/19/2023 09:23:00 PM TECHNIQUE: CT of the head was performed without the administration of intravenous contrast. Automated exposure control, iterative reconstruction, and/or weight based adjustment of the mA/kV was utilized to reduce the radiation dose to as low as reasonably achievable. COMPARISON: Head CT earlier today at 3:46 pm. CLINICAL HISTORY: Head trauma, minor (Age >= 65y). Head trauma/Fall, f/u hemorrhage. FINDINGS: BRAIN AND VENTRICLES: These images are time-stamped 9:11 p.m . Stable 2.1 x 1.7 cm hemorrhage again distending the temporal horn of the right lateral  ventricle series 2 axial images 11 and 12. There are small hyperdense bilateral superior frontal subarachnoid bleeds unchanged in size but slightly less dense than previously. No new hemorrhage is seen. There is mild cerebral atrophy and atrophic ventriculomegaly with moderately advanced small vessel disease of the cerebral white matter and small chronic lacunar infarcts in the right basal ganglia. . The cerebellum and brainstem are unremarkable. No cortical-based infarct, midline shift or mass effect is seen. There are patchy calcifications in both siphons but no hyperdense central vessel. ORBITS: Old lens replacement surgery bilaterally. Otherwise, negative orbits. SINUSES: There is trace fluid again in the left mastoid tip. Sinuses and mastoid air cells are otherwise clear. SOFT TISSUES AND SKULL: Right parietal scalp hematoma with scattered air pockets again is noted with interval new overlying skin staples. No depressed skull fracture or skull lesions are seen. IMPRESSION: 1. Stable 2.1 x 1.7 cm intraventricular hemorrhage distending the right temporal horn. 2. Small bilateral superior frontal subarachnoid hemorrhages, unchanged in size and slightly decreased in density. 3. No new hemorrhage. 4. Right parietal scalp hematoma with scattered air pockets and new overlying skin staples. 5. Atrophy and small vessel changes. Carotid atherosclerosis. Electronically signed by: Francis Quam MD 12/19/2023 09:44 PM EDT RP Workstation: [id number]   CT Cervical Spine Wo Contrast Result Date: 12/19/2023 CLINICAL DATA:  Fall  EXAM: CT CERVICAL SPINE WITHOUT CONTRAST TECHNIQUE: Multidetector CT imaging of the cervical spine was performed without intravenous contrast. Multiplanar CT image reconstructions were also generated. RADIATION DOSE REDUCTION: This exam was performed according to the departmental dose-optimization program which includes automated exposure control, adjustment of the mA and/or kV according to patient  size and/or use of iterative reconstruction technique. COMPARISON:  November 01, 2023 FINDINGS: No significant alignment abnormality. The craniocervical junction is normal. The atlantoaxial articulation is normal. The dens is normal. There is no fracture identified. Other comments: Cervical spondylosis IMPRESSION: Cervical spondylosis.  No fracture. Electronically Signed   By: Nancyann Burns M.D.   On: 12/19/2023 16:17   CT Head Wo Contrast Result Date: 12/19/2023 CLINICAL DATA:  Fall EXAM: CT HEAD WITHOUT CONTRAST TECHNIQUE: Contiguous axial images were obtained from the base of the skull through the vertex without intravenous contrast. RADIATION DOSE REDUCTION: This exam was performed according to the departmental dose-optimization program which includes automated exposure control, adjustment of the mA and/or kV according to patient size and/or use of iterative reconstruction technique. COMPARISON:  Head CT November 01, 2023, MRI March 28, 2023 FINDINGS: CT HEAD: There are small bifrontal convexity hemorrhages. There is a hemorrhage in the temporal horn of the right lateral ventricle. There is chronic white matter disease No acute ischemic changes. No mass lesion. Skull/sinuses/orbits: No significant abnormality. IMPRESSION: Small bilateral frontal convexity hemorrhages. Hemorrhage in the temporal horn of the right lateral ventricle. Electronically Signed   By: Nancyann Burns M.D.   On: 12/19/2023 16:15   DG Chest Portable 1 View Result Date: 12/19/2023 CLINICAL DATA:  [place]lemens. EXAM: PORTABLE CHEST 1 VIEW COMPARISON:  07/13/2022 FINDINGS: The heart is within normal limits in size given the patient's age and the AP projection and portable technique. The mediastinal and hilar contours are normal. Stable tortuosity of the thoracic aorta. No acute pulmonary findings. Stable underlying mild chronic bronchitic type interstitial changes. No pleural effusions or pneumothorax. IMPRESSION: No acute cardiopulmonary findings.  Electronically Signed   By: MYRTIS Stammer M.D.   On: 12/19/2023 14:50   DG Pelvis Portable Result Date: 12/19/2023 CLINICAL DATA:  Clemens EXAM: PORTABLE PELVIS 1-2 VIEWS COMPARISON:  11/01/2023 FINDINGS: 3 supine frontal views of the pelvis are obtained. Bilateral hip arthroplasties are identified in stable position, with no evidence of failure or loosening. The bones are osteopenic. No acute displaced fractures. Sacroiliac joints are unremarkable. Stable postsurgical changes lower lumbar spine. IMPRESSION: 1. No acute displaced fracture. 2. Stable bilateral hip arthroplasties. Electronically Signed   By: Ozell Daring M.D.   On: 12/19/2023 14:49    Microbiology: Results for orders placed or performed during the hospital encounter of 07/13/22  Urine Culture     Status: None   Collection Time: 07/13/22  7:25 PM   Specimen: Urine, Clean Catch  Result Value Ref Range Status   Specimen Description   Final    URINE, CLEAN CATCH Performed at [facility], [address], [place], [place] [address]    Special Requests   Final    NONE Performed at [facility], [address], [place], [place] [address]    Culture   Final    NO GROWTH Performed at [facility], [address] [place], [place] [address]    Report Status 07/15/2022 FINAL  Final    Labs: CBC: Recent Labs  Lab 12/19/23 1321 12/19/23 2359 12/20/23 0424  WBC 9.0 9.4 8.3  NEUTROABS 6.6  --   --  HGB 12.3 11.4* 10.9*  HCT 37.9 33.9* 32.5*  MCV 95.9 94.2 95.0  PLT 268 275 245   Basic Metabolic Panel: Recent Labs  Lab 12/19/23 1321 12/20/23 0424  NA 143 141  K 3.8 3.7  CL 107 107  CO2 26 25  GLUCOSE 128* 96  BUN 13 10  CREATININE 0.90 0.73  CALCIUM 8.9 8.9   Liver Function Tests: Recent Labs  Lab 12/19/23 1321  AST 18  ALT 8  ALKPHOS 65  BILITOT 0.4  PROT 6.4*  ALBUMIN 4.0   CBG: No results for input(s): GLUCAP in the last 168 hours.  Discharge time spent:  35 minutes.  Signed: Eric Nunnery, MD [facility] 12/20/2023

## 2023-12-20 NOTE — Care Management Obs Status (Signed)
 MEDICARE OBSERVATION STATUS NOTIFICATION   Patient Details  Name: [name] MRN: [id number] Date of Birth: 03/18/1931   Medicare Observation Status Notification Given:  Yes    Lucie Lunger, LCSWA 12/20/2023, 8:59 AM

## 2023-12-20 NOTE — Evaluation (Signed)
 Physical Therapy Evaluation Patient Details Name: [name] MRN: [id number] DOB: 1931/10/12 Today's Date: 12/20/2023  History of Present Illness  HPI: [name] is a 89 y.o. female with medical history significant of hypertension, degenerative disc disease, arthritis.  Patient lives alone at home and is seen today due to a fall at home.  She has fallen a couple of times over the past couple of months.  Today she was brought to the hospital by EMS after a neighbor found her on the floor.  Her neighbor checks on her a couple times a day to make sure that she is okay.  When she was found on the ground, EMS was called and the patient was brought to the hospital.  On evaluation here, she was found to have a head laceration.  A CT of the head was done which showed a small subarachnoid hemorrhage with some blood in the right horn of the ventricle.  She has no neurodeficits.  There is no mass effect.  Her blood pressure is stable.  Neurosurgery was consulted who recommended a repeat CT scan in 6 hours to ensure that there is no worsening hemorrhage.  At this point, because of her stable status and stable neuroexam, she was felt to be stable here at our hospital.  Clinical Impression  PT has fallen twice and is not sure why she is falling.  Recommend HH, however, pt states she would prefer to go to outpatient.  Pt feels she can secure transportation to OP therapy at this time.         If plan is discharge home, recommend the following: Assistance with cooking/housework;Help with stairs or ramp for entrance   Can travel by private vehicle    yes    Equipment Recommendations None recommended by PT  Recommendations for Other Services       Functional Status Assessment Patient has had a recent decline in their functional status and demonstrates the ability to make significant improvements in function in a reasonable and predictable amount of time.     Precautions / Restrictions  Precautions Precautions: Fall Recall of Precautions/Restrictions: Intact Restrictions Weight Bearing Restrictions Per Provider Order: No      Mobility  Bed Mobility Overal bed mobility: Modified Independent                  Transfers Overall transfer level: Modified independent Equipment used: Rolling walker (2 wheels)                    Ambulation/Gait Ambulation/Gait assistance: Modified independent (Device/Increase time) Gait Distance (Feet): 150 Feet Assistive device: Rolling walker (2 wheels) Gait Pattern/deviations: Decreased step length - right, Decreased step length - left   Gait velocity interpretation: <1.8 ft/sec, indicate of risk for recurrent falls            Pertinent Vitals/Pain Pain Assessment Pain Assessment: 0-10 Pain Score: 3  Pain Location: Rt shoulder Pain Descriptors / Indicators: Aching Pain Intervention(s): Limited activity within patient's tolerance    Home Living Family/patient expects to be discharged to:: Private residence Living Arrangements: Alone Available Help at Discharge: Friend(s);Neighbor Type of Home: House Home Access: Stairs to enter Entrance Stairs-Rails: Left;Right;Can reach both Secretary/administrator of Steps: 4   Home Layout: One level Home Equipment: Shower seat;Grab bars - tub/shower;Wheelchair - manual;Other (comment) Additional Comments: uses rollator    Prior Function Prior Level of Function : Independent/Modified Independent  Extremity/Trunk Assessment        Lower Extremity Assessment Lower Extremity Assessment: Generalized weakness       Communication        Cognition Arousal: Alert Behavior During Therapy: WFL for tasks assessed/performed   PT - Cognitive impairments: No apparent impairments                                 Cueing  Verbal         Exercises General Exercises - Lower Extremity Ankle Circles/Pumps: AROM, 10 reps,  Both, Seated Long Arc Quad: 10 reps, Both Mini-Sqauts: 5 reps   Assessment/Plan    PT Assessment Patient needs continued PT services  PT Problem List Decreased strength;Decreased activity tolerance;Decreased balance       PT Treatment Interventions Therapeutic exercise;Balance training;Therapeutic activities    PT Goals (Current goals can be found in the Care Plan section)  Acute Rehab PT Goals Patient Stated Goal: to go home PT Goal Formulation: With patient Time For Goal Achievement: 12/22/23 Potential to Achieve Goals: Good    Frequency Min 3X/week        AM-PAC PT 6 Clicks Mobility  Outcome Measure Help needed turning from your back to your side while in a flat bed without using bedrails?: A Little Help needed moving from lying on your back to sitting on the side of a flat bed without using bedrails?: A Little Help needed moving to and from a bed to a chair (including a wheelchair)?: A Little Help needed standing up from a chair using your arms (e.g., wheelchair or bedside chair)?: A Little Help needed to walk in hospital room?: A Little Help needed climbing 3-5 steps with a railing? : A Little 6 Click Score: 18    End of Session Equipment Utilized During Treatment: Gait belt Activity Tolerance: Patient tolerated treatment well Patient left: in chair;with call bell/phone within reach Nurse Communication: Mobility status PT Visit Diagnosis: Unsteadiness on feet (R26.81);Repeated falls (R29.6);Muscle weakness (generalized) (M62.81)    Time: 9149-9097 PT Time Calculation (min) (ACUTE ONLY): 12 min   Charges:   PT Evaluation $PT Eval Low Complexity: 1 Low PT Treatments $Therapeutic Exercise: 8-22 mins PT General Charges $$ ACUTE PT VISIT: 1 Visit         Montie Metro, PT CLT [phone]  12/20/2023, 9:08 AM

## 2023-12-20 NOTE — Plan of Care (Signed)

## 2023-12-20 NOTE — TOC Initial Note (Signed)
 Transition of Care [facility]) - Initial/Assessment Note    Patient Details  Name: [name] MRN: [id number] Date of Birth: 01-22-1932  Transition of Care [facility]) CM/SW Contact:    Lucie Lunger, LCSWA Phone Number: 12/20/2023, 9:11 AM  Clinical Narrative:                 CSW notes PT is recommending OP PT for pt at D/C. CSW met with pt at bedside to complete assessment. Pt states she lives alone and is independent in completing her ADLs. CSW inquired about pts interest in [facility] PT being arranged. Pt states she prefers to get out of the house and go to therapy if able. CSW explained that OP PT referral can be sent to [facility] PT clinic, pt is agreeable and states that she will have transportation for this. CSW to make referral. TOC to follow.   Expected Discharge Plan: OP Rehab Barriers to Discharge: Continued Medical Work up   Patient Goals and CMS Choice Patient states their goals for this hospitalization and ongoing recovery are:: return home CMS Medicare.gov Compare Post Acute Care list provided to:: Patient Choice offered to / list presented to : Patient      Expected Discharge Plan and Services In-house Referral: Clinical Social Work Discharge Planning Services: CM Consult   Living arrangements for the past 2 months: Single Family Home                                      Prior Living Arrangements/Services Living arrangements for the past 2 months: Single Family Home Lives with:: Self Patient language and need for interpreter reviewed:: Yes Do you feel safe going back to the place where you live?: Yes      Need for Family Participation in Patient Care: Yes (Comment) Care giver support system in place?: Yes (comment)   Criminal Activity/Legal Involvement Pertinent to Current Situation/Hospitalization: No - Comment as needed  Activities of Daily Living   ADL Screening (condition at time of admission) Independently performs ADLs?: Yes (appropriate for  developmental age) Is the patient deaf or have difficulty hearing?: No Does the patient have difficulty seeing, even when wearing glasses/contacts?: No Does the patient have difficulty concentrating, remembering, or making decisions?: No  Permission Sought/Granted                  Emotional Assessment Appearance:: Appears stated age Attitude/Demeanor/Rapport: Engaged Affect (typically observed): Accepting Orientation: : Oriented to Self, Oriented to Place, Oriented to  Time, Oriented to Situation Alcohol / Substance Use: Not Applicable Psych Involvement: No (comment)  Admission diagnosis:  Subarachnoid bleed (HCC) [I60.9] Fall, initial encounter [W19.XXXA] Laceration of scalp, initial encounter [S01.01XA] Traumatic injury of head, initial encounter [S09.90XA] Traumatic intracerebral hemorrhage with unknown loss of consciousness status, unspecified laterality, initial encounter [facility]) [S06.36AA] Patient Active Problem List   Diagnosis Date Noted   Subarachnoid bleed (HCC) 12/19/2023   Multiple falls 12/19/2023   Essential hypertension 07/14/2022   Generalized weakness 07/14/2022   UTI (urinary tract infection) 07/14/2022   Peripheral edema 07/14/2022   Acute metabolic encephalopathy 07/13/2022   Bilateral hip joint arthritis 09/06/2013   Degeneration of intervertebral disc of lumbar region 09/06/2013   Backache 01/24/2009   Closed fracture of metatarsal bone 04/20/2007   PCP:  Marvine Rush, MD Pharmacy:   [facility] - [place], [place] - [address]  [place] [address] Phone: [phone] Fax: [phone]     Social Drivers of Health (SDOH) Social History: SDOH Screenings   Food Insecurity: No Food Insecurity (03/29/2023)   Received from [facility]  Housing: Low Risk  (07/13/2022)  Transportation Needs: No Transportation Needs (03/29/2023)   Received from [facility]  Utilities: Low Risk  (03/29/2023)   Received from [facility]  Financial Resource Strain: Low Risk  (03/29/2023)   Received from [facility]  Physical Activity: Patient Unable To Answer (03/28/2023)   Received from [facility]  Social Connections: Moderately Isolated (03/28/2023)   Received from [facility]  Stress: Patient Unable To Answer (03/28/2023)   Received from [facility]  Tobacco Use: Low Risk  (12/20/2023)  Health Literacy: Medium Risk (03/29/2023)   Received from [facility]   SDOH Interventions:     Readmission Risk Interventions     No data to display

## 2023-12-25 ENCOUNTER — Other Ambulatory Visit: Payer: Self-pay

## 2023-12-25 ENCOUNTER — Encounter: Payer: Self-pay | Admitting: Emergency Medicine

## 2023-12-25 ENCOUNTER — Ambulatory Visit
Admission: EM | Admit: 2023-12-25 | Discharge: 2023-12-25 | Disposition: A | Discharge status: Home / Self Care | Attending: Family Medicine | Admitting: Family Medicine

## 2023-12-25 DIAGNOSIS — S0101XD Laceration without foreign body of scalp, subsequent encounter: Secondary | ICD-10-CM

## 2023-12-25 NOTE — ED Triage Notes (Signed)
 Pt presents to UC for staple removal from right scalp. Pt was seen in ED at time of injury.

## 2023-12-25 NOTE — ED Provider Notes (Signed)
 [facility]    CSN: [id number] Arrival date & time: 12/25/23  1103      History   Chief Complaint Chief Complaint  Patient presents with   Suture / Staple Removal    HPI [name] is a 89 y.o. female.   Patient presenting today for staple removal to the right parietal scalp.  Staples were placed in the emergency department on 12/19/2023 where she was admitted for a traumatic intracerebral hemorrhage post fall.  She states she has been doing very well, her pain is minimal and she has had no bleeding or drainage from the wound site.  Has not been cleaning it with anything in particular.    Past Medical History:  Diagnosis Date   Bilateral hip joint arthritis 09/06/2013   Degeneration of intervertebral disc of lumbar region 09/06/2013   Hypertension     Patient Active Problem List   Diagnosis Date Noted   Traumatic intracerebral hemorrhage with unknown loss of consciousness status (HCC) 12/20/2023   Head trauma 12/20/2023   Subarachnoid bleed (HCC) 12/19/2023   Multiple falls 12/19/2023   Essential hypertension 07/14/2022   Generalized weakness 07/14/2022   UTI (urinary tract infection) 07/14/2022   Peripheral edema 07/14/2022   Acute metabolic encephalopathy 07/13/2022   Bilateral hip joint arthritis 09/06/2013   Degeneration of intervertebral disc of lumbar region 09/06/2013   Backache 01/24/2009   Closed fracture of metatarsal bone 04/20/2007    Past Surgical History:  Procedure Laterality Date   ABDOMINAL HYSTERECTOMY     APPENDECTOMY     BREAST BIOPSY     CATARACT EXTRACTION W/PHACO Right 09/03/2012   Procedure: CATARACT EXTRACTION PHACO AND INTRAOCULAR LENS PLACEMENT (IOC);  Surgeon: Cherene Mania, MD;  Location: [facility];  Service: Ophthalmology;  Laterality: Right;  CDE: 12.37   CATARACT EXTRACTION W/PHACO Left 09/28/2012   Procedure: CATARACT EXTRACTION PHACO AND INTRAOCULAR LENS PLACEMENT (IOC);  Surgeon: Cherene Mania, MD;  Location: [facility];   Service: Ophthalmology;  Laterality: Left;  CDE 18.52   closed redution external fixation right wrist Right    INSERTION OF MESH N/A 06/30/2015   Procedure: INSERTION OF MESH;  Surgeon: Oneil Budge, MD;  Location: [facility];  Service: General;  Laterality: N/A;   JOINT REPLACEMENT     bilateral Hips at [facility] FUSION     Dr. Gaither   TONSILLECTOMY     UMBILICAL HERNIA REPAIR N/A 06/30/2015   Procedure: UMBILICAL HERNIORRHAPHY WITH MESH;  Surgeon: Oneil Budge, MD;  Location: [facility];  Service: General;  Laterality: N/A;    OB History   No obstetric history on file.      Home Medications    Prior to Admission medications   Medication Sig Start Date End Date Taking? Authorizing Provider  acetaminophen  (TYLENOL ) 500 MG tablet Take 2 tablets (1,000 mg total) by mouth every 8 (eight) hours as needed for mild pain (pain score 1-3), fever or headache. 12/20/23 12/19/24  Ricky Fines, MD  aspirin  EC 81 MG tablet Take 1 tablet (81 mg total) by mouth daily. Hold this medication for 10 days; once you resume taking it make sure that you swallow whole. 12/30/23   Ricky Fines, MD  diltiazem  (CARDIZEM  CD) 180 MG 24 hr capsule Take 1 capsule (180 mg total) by mouth daily. 07/16/22   Ricky Fines, MD  metoprolol  tartrate (LOPRESSOR ) 100 MG tablet Take 1 tablet (100 mg total) by mouth 2 (two) times daily. 07/15/22   Ricky Fines, MD  Family History History reviewed. No pertinent family history.  Social History Social History   Tobacco Use   Smoking status: Never   Smokeless tobacco: Never  Substance Use Topics   Alcohol use: No   Drug use: No     Allergies   Patient has no known allergies.   Review of Systems Review of Systems Per HPI  Physical Exam Triage Vital Signs ED Triage Vitals  Encounter Vitals Group     BP 12/25/23 1129 (!) (P) 156/80     Girls Systolic BP Percentile --      Girls Diastolic BP Percentile --      Boys Systolic BP Percentile --      Boys  Diastolic BP Percentile --      Pulse Rate 12/25/23 1129 (P) 65     Resp 12/25/23 1129 (P) 16     Temp 12/25/23 1129 (!) (P) 97.5 F (36.4 C)     Temp Source 12/25/23 1129 (P) Oral     SpO2 12/25/23 1129 (P) 96 %     Weight --      Height --      Head Circumference --      Peak Flow --      Pain Score 12/25/23 1126 0     Pain Loc --      Pain Education --      Exclude from Growth Chart --    No data found.  Updated Vital Signs BP (!) (P) 156/80 (BP Location: Right Arm)   Pulse (P) 65   Temp (!) (P) 97.5 F (36.4 C) (Oral)   Resp (P) 16   SpO2 (P) 96%   Visual Acuity Right Eye Distance:   Left Eye Distance:   Bilateral Distance:    Right Eye Near:   Left Eye Near:    Bilateral Near:     Physical Exam Vitals and nursing note reviewed.  Constitutional:      Appearance: Normal appearance. She is not ill-appearing.  HENT:     Head: Atraumatic.  Eyes:     Extraocular Movements: Extraocular movements intact.     Conjunctiva/sclera: Conjunctivae normal.  Cardiovascular:     Rate and Rhythm: Normal rate.  Pulmonary:     Effort: Pulmonary effort is normal.  Musculoskeletal:        General: Normal range of motion.     Cervical back: Normal range of motion and neck supple.  Skin:    General: Skin is warm.     Comments: Dried blood present to the surrounding area of the scalp laceration right parietal scalp.  Six staples intact  Neurological:     Mental Status: She is alert. Mental status is at baseline.  Psychiatric:        Mood and Affect: Mood normal.        Thought Content: Thought content normal.        Judgment: Judgment normal.     UC Treatments / Results  Labs (all labs ordered are listed, but only abnormal results are displayed) Labs Reviewed - No data to display  EKG   Radiology No results found.  Procedures Procedures (including critical care time)  Medications Ordered in UC Medications - No data to display  Initial Impression / Assessment  and Plan / UC Course  I have reviewed the triage vital signs and the nursing notes.  Pertinent labs & imaging results that were available during my care of the patient were reviewed by me and considered  in my medical decision making (see chart for details).    6 staples removed without complication, wound inspected and healing well without signs of infection.  Discussed home wound care while remaining area is healing and return precautions.  Final Clinical Impressions(s) / UC Diagnoses   Final diagnoses:  Laceration of scalp, subsequent encounter     Discharge Instructions      Clean the area with soap and water 1-2 times daily to keep from getting infected    ED Prescriptions   None    PDMP not reviewed this encounter.   Stuart Vernell Norris, PA-C 12/25/23 1234

## 2023-12-25 NOTE — Discharge Instructions (Signed)
 Clean the area with soap and water 1-2 times daily to keep from getting infected

## 2024-01-09 DIAGNOSIS — Z6832 Body mass index (BMI) 32.0-32.9, adult: Secondary | ICD-10-CM | POA: Diagnosis not present

## 2024-01-09 DIAGNOSIS — Z7689 Persons encountering health services in other specified circumstances: Secondary | ICD-10-CM | POA: Diagnosis not present

## 2024-01-09 DIAGNOSIS — E66811 Obesity, class 1: Secondary | ICD-10-CM | POA: Diagnosis not present

## 2024-01-09 DIAGNOSIS — N390 Urinary tract infection, site not specified: Secondary | ICD-10-CM | POA: Diagnosis not present

## 2024-01-09 DIAGNOSIS — E6609 Other obesity due to excess calories: Secondary | ICD-10-CM | POA: Diagnosis not present

## 2024-01-09 DIAGNOSIS — R600 Localized edema: Secondary | ICD-10-CM | POA: Diagnosis not present

## 2024-01-09 DIAGNOSIS — Z23 Encounter for immunization: Secondary | ICD-10-CM | POA: Diagnosis not present

## 2024-02-11 DIAGNOSIS — Z9181 History of falling: Secondary | ICD-10-CM | POA: Diagnosis not present

## 2024-02-11 DIAGNOSIS — R399 Unspecified symptoms and signs involving the genitourinary system: Secondary | ICD-10-CM | POA: Diagnosis not present

## 2024-02-11 DIAGNOSIS — L0390 Cellulitis, unspecified: Secondary | ICD-10-CM | POA: Diagnosis not present

## 2024-02-11 DIAGNOSIS — R600 Localized edema: Secondary | ICD-10-CM | POA: Diagnosis not present

## 2024-02-12 DIAGNOSIS — R399 Unspecified symptoms and signs involving the genitourinary system: Secondary | ICD-10-CM | POA: Diagnosis not present
